# Patient Record
Sex: Female | Born: 1955 | Race: White | Hispanic: No | Marital: Married | Smoking: Never smoker
Health system: Southern US, Community
[De-identification: ages and names within clinical notes are randomized; demographics above are authoritative.]

## PROBLEM LIST (undated history)

## (undated) DIAGNOSIS — F419 Anxiety disorder, unspecified: Secondary | ICD-10-CM

## (undated) DIAGNOSIS — E039 Hypothyroidism, unspecified: Secondary | ICD-10-CM

## (undated) DIAGNOSIS — R609 Edema, unspecified: Secondary | ICD-10-CM

## (undated) DIAGNOSIS — E559 Vitamin D deficiency, unspecified: Secondary | ICD-10-CM

## (undated) DIAGNOSIS — R002 Palpitations: Secondary | ICD-10-CM

## (undated) DIAGNOSIS — T4145XA Adverse effect of unspecified anesthetic, initial encounter: Secondary | ICD-10-CM

## (undated) DIAGNOSIS — I10 Essential (primary) hypertension: Secondary | ICD-10-CM

## (undated) DIAGNOSIS — R0789 Other chest pain: Secondary | ICD-10-CM

## (undated) DIAGNOSIS — F32A Depression, unspecified: Secondary | ICD-10-CM

## (undated) DIAGNOSIS — K219 Gastro-esophageal reflux disease without esophagitis: Secondary | ICD-10-CM

## (undated) DIAGNOSIS — R0602 Shortness of breath: Secondary | ICD-10-CM

## (undated) DIAGNOSIS — Z8669 Personal history of other diseases of the nervous system and sense organs: Secondary | ICD-10-CM

## (undated) DIAGNOSIS — M549 Dorsalgia, unspecified: Secondary | ICD-10-CM

## (undated) DIAGNOSIS — M1990 Unspecified osteoarthritis, unspecified site: Secondary | ICD-10-CM

## (undated) DIAGNOSIS — E785 Hyperlipidemia, unspecified: Secondary | ICD-10-CM

## (undated) DIAGNOSIS — T8859XA Other complications of anesthesia, initial encounter: Secondary | ICD-10-CM

## (undated) DIAGNOSIS — M2550 Pain in unspecified joint: Secondary | ICD-10-CM

## (undated) HISTORY — DX: Shortness of breath: R06.02

## (undated) HISTORY — DX: Palpitations: R00.2

## (undated) HISTORY — DX: Depression, unspecified: F32.A

## (undated) HISTORY — DX: Essential (primary) hypertension: I10

## (undated) HISTORY — DX: Dorsalgia, unspecified: M54.9

## (undated) HISTORY — DX: Anxiety disorder, unspecified: F41.9

## (undated) HISTORY — DX: Vitamin D deficiency, unspecified: E55.9

## (undated) HISTORY — DX: Hyperlipidemia, unspecified: E78.5

## (undated) HISTORY — DX: Edema, unspecified: R60.9

## (undated) HISTORY — DX: Pain in unspecified joint: M25.50

## (undated) HISTORY — DX: Other chest pain: R07.89

## (undated) HISTORY — DX: Gastro-esophageal reflux disease without esophagitis: K21.9

---

## 1985-02-11 HISTORY — PX: TEMPOROMANDIBULAR JOINT SURGERY: SHX35

## 1998-09-26 ENCOUNTER — Emergency Department (HOSPITAL_COMMUNITY): Admission: EM | Admit: 1998-09-26 | Discharge: 1998-09-26 | Payer: Self-pay | Admitting: Emergency Medicine

## 1999-07-12 ENCOUNTER — Other Ambulatory Visit: Admission: RE | Admit: 1999-07-12 | Discharge: 1999-07-12 | Payer: Self-pay | Admitting: Obstetrics and Gynecology

## 1999-07-13 ENCOUNTER — Ambulatory Visit (HOSPITAL_COMMUNITY): Admission: RE | Admit: 1999-07-13 | Discharge: 1999-07-13 | Payer: Self-pay | Admitting: Obstetrics and Gynecology

## 1999-07-13 ENCOUNTER — Encounter: Admission: RE | Admit: 1999-07-13 | Discharge: 1999-07-13 | Payer: Self-pay | Admitting: Obstetrics and Gynecology

## 1999-07-13 ENCOUNTER — Encounter: Payer: Self-pay | Admitting: Obstetrics and Gynecology

## 2000-10-02 ENCOUNTER — Other Ambulatory Visit: Admission: RE | Admit: 2000-10-02 | Discharge: 2000-10-02 | Payer: Self-pay | Admitting: Obstetrics and Gynecology

## 2000-10-07 ENCOUNTER — Encounter: Payer: Self-pay | Admitting: Obstetrics and Gynecology

## 2000-10-07 ENCOUNTER — Encounter: Admission: RE | Admit: 2000-10-07 | Discharge: 2000-10-07 | Payer: Self-pay | Admitting: Obstetrics and Gynecology

## 2001-11-26 ENCOUNTER — Encounter: Payer: Self-pay | Admitting: Obstetrics and Gynecology

## 2001-11-26 ENCOUNTER — Encounter: Admission: RE | Admit: 2001-11-26 | Discharge: 2001-11-26 | Payer: Self-pay | Admitting: Obstetrics and Gynecology

## 2002-12-09 ENCOUNTER — Encounter: Admission: RE | Admit: 2002-12-09 | Discharge: 2002-12-09 | Payer: Self-pay | Admitting: Obstetrics and Gynecology

## 2005-02-11 HISTORY — PX: CERVICAL DISC SURGERY: SHX588

## 2005-03-29 ENCOUNTER — Encounter: Admission: RE | Admit: 2005-03-29 | Discharge: 2005-03-29 | Payer: Self-pay | Admitting: Family Medicine

## 2005-07-10 ENCOUNTER — Ambulatory Visit (HOSPITAL_COMMUNITY): Admission: RE | Admit: 2005-07-10 | Discharge: 2005-07-11 | Payer: Self-pay | Admitting: Neurosurgery

## 2006-01-16 ENCOUNTER — Encounter: Admission: RE | Admit: 2006-01-16 | Discharge: 2006-04-16 | Payer: Self-pay | Admitting: Family Medicine

## 2006-04-30 ENCOUNTER — Encounter: Admission: RE | Admit: 2006-04-30 | Discharge: 2006-04-30 | Payer: Self-pay | Admitting: Obstetrics and Gynecology

## 2006-09-23 ENCOUNTER — Encounter: Admission: RE | Admit: 2006-09-23 | Discharge: 2006-11-05 | Payer: Self-pay | Admitting: Family Medicine

## 2007-05-01 ENCOUNTER — Encounter: Admission: RE | Admit: 2007-05-01 | Discharge: 2007-05-01 | Payer: Self-pay | Admitting: Obstetrics and Gynecology

## 2008-03-31 ENCOUNTER — Encounter: Admission: RE | Admit: 2008-03-31 | Discharge: 2008-03-31 | Payer: Self-pay | Admitting: Neurosurgery

## 2008-05-19 ENCOUNTER — Encounter: Admission: RE | Admit: 2008-05-19 | Discharge: 2008-05-19 | Payer: Self-pay | Admitting: Obstetrics and Gynecology

## 2009-02-11 HISTORY — PX: VAGINAL HYSTERECTOMY: SUR661

## 2009-02-11 HISTORY — PX: BLADDER SURGERY: SHX569

## 2009-07-04 ENCOUNTER — Encounter: Admission: RE | Admit: 2009-07-04 | Discharge: 2009-07-04 | Payer: Self-pay | Admitting: Family Medicine

## 2009-10-09 ENCOUNTER — Ambulatory Visit (HOSPITAL_COMMUNITY)
Admission: RE | Admit: 2009-10-09 | Discharge: 2009-10-10 | Payer: Self-pay | Source: Home / Self Care | Admitting: Obstetrics and Gynecology

## 2009-10-09 ENCOUNTER — Encounter (HOSPITAL_COMMUNITY): Payer: Self-pay | Admitting: Obstetrics and Gynecology

## 2009-10-14 ENCOUNTER — Inpatient Hospital Stay (HOSPITAL_COMMUNITY): Admission: AD | Admit: 2009-10-14 | Discharge: 2009-10-14 | Payer: Self-pay | Admitting: Obstetrics and Gynecology

## 2010-01-11 HISTORY — PX: KNEE ARTHROSCOPY: SHX127

## 2010-03-04 ENCOUNTER — Encounter: Payer: Self-pay | Admitting: Neurosurgery

## 2010-04-26 LAB — URINALYSIS, ROUTINE W REFLEX MICROSCOPIC
Bilirubin Urine: NEGATIVE
Glucose, UA: NEGATIVE mg/dL
Nitrite: NEGATIVE
Specific Gravity, Urine: 1.02 (ref 1.005–1.030)
Urobilinogen, UA: 0.2 mg/dL (ref 0.0–1.0)

## 2010-04-26 LAB — DIFFERENTIAL
Basophils Absolute: 0 10*3/uL (ref 0.0–0.1)
Basophils Relative: 1 % (ref 0–1)
Eosinophils Absolute: 0.2 10*3/uL (ref 0.0–0.7)
Eosinophils Relative: 2 % (ref 0–5)

## 2010-04-26 LAB — CBC
Hemoglobin: 11.4 g/dL — ABNORMAL LOW (ref 12.0–15.0)
MCHC: 34.7 g/dL (ref 30.0–36.0)
MCV: 94 fL (ref 78.0–100.0)
RBC: 3.49 MIL/uL — ABNORMAL LOW (ref 3.87–5.11)

## 2010-04-26 LAB — URINE MICROSCOPIC-ADD ON

## 2010-04-27 LAB — URINALYSIS, ROUTINE W REFLEX MICROSCOPIC
Glucose, UA: NEGATIVE mg/dL
Hgb urine dipstick: NEGATIVE
Specific Gravity, Urine: >1.03 — ABNORMAL HIGH (ref 1.005–1.030)

## 2010-04-27 LAB — CBC
HCT: 29.6 % — ABNORMAL LOW (ref 36.0–46.0)
HCT: 35.1 % — ABNORMAL LOW (ref 36.0–46.0)
HCT: 41.7 % (ref 36.0–46.0)
MCH: 32.6 pg (ref 26.0–34.0)
MCHC: 34.1 g/dL (ref 30.0–36.0)
MCV: 94.4 fL (ref 78.0–100.0)
Platelets: 110 10*3/uL — ABNORMAL LOW (ref 150–400)
RBC: 3.13 MIL/uL — ABNORMAL LOW (ref 3.87–5.11)
RDW: 12.5 % (ref 11.5–15.5)
RDW: 12.6 % (ref 11.5–15.5)
WBC: 5.4 10*3/uL (ref 4.0–10.5)
WBC: 8.9 10*3/uL (ref 4.0–10.5)

## 2010-04-27 LAB — URINE MICROSCOPIC-ADD ON

## 2010-06-29 NOTE — Op Note (Signed)
NAME:  Kaitlin, Meyers                  ACCOUNT NO.:  [id number]   MEDICAL RECORD NO.:  [id number]          PATIENT TYPE:  AMB   LOCATION:  SDS                          FACILITY:  [facility]   PHYSICIAN:  Coletta Memos, M.D.     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   PROCEDURE:  1.  Anterior cervical decompression C5-6, C6-7.  2.  Arthrodesis C5-6, C6-7; at C5-6 with the use a 6-mm Synthes ACS PEEK      interbody graft; at C6-7 a 7-mm Synthes PEEK ACS graft.  3.  Morselized auto and allograft were combined.  4.  Anterior plating Synthes ACCS 30-mm plate.   COMPLICATIONS:  None.   SURGEON:  Coletta Memos, M.D.   ASSISTANT:  Hewitt Shorts, M.D.   INDICATIONS:  [name] is a woman who I have followed since this summer.  She  has what is a very large disk herniation at C5-6, and a significant amount  of spondylitic change present at C6-7.  She has narrowed disk spaces,  anterior and posterior osteophytes at both levels, and they clearly causing  her some neck pain which she has.  She describes some problems in the right  upper extremity which she did not have during the summer.  She did not have  pain in the arm, but did have some pain in the right shoulder and anterior  chest, in between her shoulder blades.   I recommended, she agreed to undergo an anterior cervical decompression and  arthrodesis with plating.   OPERATIVE TECHNIQUE:  [name] was brought to the operating room, intubated  and placed under a general anesthetic without difficulty.  Her neck was  prepped and she was draped in a sterile fashion.  Her  head was positioned  essentially in neutral position on a horseshoe headrest.  I opened the skin  with a #10 blade and took this down to the platysma sharply.  I dissected  rostrally and caudally in a plane superior to the platysma.  I opened the  platysma in a horizontal fashion.  I then dissected rostrally and caudally  in a plane inferior to the platysma.  I created an avascular corridor to the  cervical spine.  I placed a spinal needle and that was at C6-7.   I reflected the longus colli muscles bilaterally.  I placed a self-retaining  retractor and then distraction pins.  I opened the disk spaces of both C5-6  and C6-7.  I then proceeded with the operative microscope and brought that  into the field.  I removed the disk at C6-7, after placing distraction pins  at C6 and C7, using high-speed drill, curettes, Kerrison punches and  pituitary rongeurs.  There was a significant amount of  bony overgrowth and  some partial calcification of the posterior longitudinal ligament.  I was  able to remove this and observed the dura, which was being compressed by the  thickened ligament and disk.  I then decompressed both C7 nerve roots.  There was a sheet essentially of veins lying over the C7 nerve root on the  right side.  I opened that to some degree, but did not feel that, for a full  decompression, I needed to completely open that venous sheath, so I  therefore left it.  On the left side I achieved good decompression also with  Kerrison punches and a high-speed drill.   I prepared the endplates then for arthrodesis using the high-speed drill.  I  used the sizers and chose a 7-mm PEEK interbody graft.  I filled that with  morselized auto and allograft DBX bone putty.  I placed that into the disk  space, after using some Gelfoam to slow ooze in the lateral gutters.  I  placed the grafts and then let the distraction off.  I removed the pin at  C7.  I then placed that into C5.   I then repeated  the procedure and performed a complete diskectomy C5-6,  using curettes, pituitary rongeurs, high-speed drill and Kerrison punches.  This space was actually much tighter than was the C6-7 space.  I had to do a  significant amount of drilling to open up the disk space, and then to create  ledges so that the Kerrison punch was useful.  Dr. Danielle Dess assisted and we  were able to get underneath the posterior longitudinal ligament, which was  quite thick.  There was also an essentially chunk of old disk material which  was compressing the left side of the cord.  That was removed, after careful  dissection, without undue trauma.  I then completed the removal of the  posterior longitudinal ligament to fully decompress both C6 nerve roots.  Once that was done, I irrigated.  I then used a size template and chose a 6-  mm implant for that level.  I placed that, let the distraction off.  Hemostasis was obtained with some Gelfoam in the lateral margins again.   The endplates were prepared for arthrodesis at C5 with the use of a high-  speed drill.   I then proceeded to the anterior plating.  I chose a 30-mm plate.  Six  screws were used, two in C5, two in C6, two in C7.  Self-tapping screws were  used after using a hand drill to create the pilot holes.  This was done  without difficulty.  X-ray showed that the plate, screws and plugs were all  in good position.  I then irrigated the wound.  I then closed the wound in  layered fashion using Vicryl sutures to reapproximate the platysma and then  subcutaneous tissue.  Dermabond was used for sterile dressing.           ______________________________  Coletta Memos, M.D.     KC/MEDQ  D:  07/10/2005  T:  07/10/2005  Job:  [id number]

## 2010-12-27 ENCOUNTER — Other Ambulatory Visit: Payer: Self-pay | Admitting: Orthopedic Surgery

## 2010-12-31 ENCOUNTER — Encounter (HOSPITAL_COMMUNITY): Payer: Self-pay | Admitting: Pharmacy Technician

## 2011-01-03 ENCOUNTER — Other Ambulatory Visit (HOSPITAL_COMMUNITY): Payer: Self-pay

## 2011-01-10 ENCOUNTER — Encounter (HOSPITAL_COMMUNITY)
Admission: RE | Admit: 2011-01-10 | Discharge: 2011-01-10 | Disposition: A | Discharge status: Home / Self Care | Payer: BC Managed Care – PPO | Source: Ambulatory Visit | Attending: Orthopedic Surgery | Admitting: Orthopedic Surgery

## 2011-01-10 ENCOUNTER — Other Ambulatory Visit: Payer: Self-pay

## 2011-01-10 ENCOUNTER — Encounter (HOSPITAL_COMMUNITY): Payer: Self-pay

## 2011-01-10 HISTORY — DX: Adverse effect of unspecified anesthetic, initial encounter: T41.45XA

## 2011-01-10 HISTORY — DX: Hypothyroidism, unspecified: E03.9

## 2011-01-10 HISTORY — DX: Other complications of anesthesia, initial encounter: T88.59XA

## 2011-01-10 LAB — URINALYSIS, ROUTINE W REFLEX MICROSCOPIC
Bilirubin Urine: NEGATIVE
Glucose, UA: NEGATIVE mg/dL
Hgb urine dipstick: NEGATIVE
Ketones, ur: NEGATIVE mg/dL
Protein, ur: NEGATIVE mg/dL

## 2011-01-10 LAB — COMPREHENSIVE METABOLIC PANEL
ALT: 23 U/L (ref 0–35)
AST: 19 U/L (ref 0–37)
CO2: 27 mEq/L (ref 19–32)
Chloride: 101 mEq/L (ref 96–112)
Creatinine, Ser: 0.82 mg/dL (ref 0.50–1.10)
GFR calc Af Amer: >90 mL/min (ref >90)
GFR calc non Af Amer: 79 mL/min — ABNORMAL LOW (ref >90)
Glucose, Bld: 87 mg/dL (ref 70–99)
Sodium: 139 mEq/L (ref 135–145)
Total Bilirubin: 0.2 mg/dL — ABNORMAL LOW (ref 0.3–1.2)

## 2011-01-10 LAB — CBC
Hemoglobin: 15.3 g/dL — ABNORMAL HIGH (ref 12.0–15.0)
MCV: 90.9 fL (ref 78.0–100.0)
Platelets: 162 10*3/uL (ref 150–400)
RBC: 4.85 MIL/uL (ref 3.87–5.11)
WBC: 8.2 10*3/uL (ref 4.0–10.5)

## 2011-01-10 LAB — DIFFERENTIAL
Basophils Absolute: 0 10*3/uL (ref 0.0–0.1)
Basophils Relative: 1 % (ref 0–1)
Eosinophils Relative: 1 % (ref 0–5)
Monocytes Absolute: 0.5 10*3/uL (ref 0.1–1.0)

## 2011-01-10 LAB — SURGICAL PCR SCREEN
MRSA, PCR: NEGATIVE
Staphylococcus aureus: POSITIVE — AB

## 2011-01-10 MED ORDER — CHLORHEXIDINE GLUCONATE 4 % EX LIQD: 60.0000 mL | Freq: Once | CUTANEOUS | Status: DC

## 2011-01-10 NOTE — Pre-Procedure Instructions (Addendum)
20 Kaitlin Meyers  01/10/2011   Your procedure is scheduled on:  Dec. 3, 2012 Monday  Report to [facility] at 0930  Call this number if you have problems the morning of surgery: [phone]   Remember:   Do not eat food:After Midnight.  May have clear liquids: up to 4 Hours before arrival.  Clear liquids include soda, tea, black coffee, apple or grape juice, broth.  Take these medicines the morning of surgery with A SIP OF WATER: levoxyl   Do not wear jewelry, make-up or nail polish.  Do not wear lotions, powders, or perfumes. You may wear deodorant.  Do not shave 48 hours prior to surgery.  Do not bring valuables to the hospital.  Contacts, dentures or bridgework may not be worn into surgery.  Leave suitcase in the car. After surgery it may be brought to your room.  For patients admitted to the hospital, checkout time is 11:00 AM the day of discharge.   Patients discharged the day of surgery will not be allowed to drive home.  Name and phone number of your driver: NA  Special Instructions: CHG Shower Use Special Wash: 1/2 bottle night before surgery and 1/2 bottle morning of surgery.   Please read over the following fact sheets that you were given: Pain Booklet, Blood Transfusion Information, Total Joint Packet and Surgical Site Infection Prevention

## 2011-01-11 LAB — URINE CULTURE
Colony Count: 30000
Culture  Setup Time: 201211292108

## 2011-01-13 MED ORDER — VANCOMYCIN HCL IN DEXTROSE 1-5 GM/200ML-% IV SOLN
1000.0000 mg | INTRAVENOUS | Status: AC
  Administered 2011-01-14: 1000 mg via INTRAVENOUS
  Filled 2011-01-13: qty 200

## 2011-01-14 ENCOUNTER — Encounter (HOSPITAL_COMMUNITY): Payer: Self-pay | Admitting: *Deleted

## 2011-01-14 ENCOUNTER — Encounter (HOSPITAL_COMMUNITY)
Admission: RE | Disposition: A | Discharge status: Home Health | Payer: Self-pay | Source: Ambulatory Visit | Attending: Orthopedic Surgery

## 2011-01-14 ENCOUNTER — Encounter (HOSPITAL_COMMUNITY): Payer: Self-pay | Admitting: Certified Registered"

## 2011-01-14 ENCOUNTER — Inpatient Hospital Stay (HOSPITAL_COMMUNITY): Payer: BC Managed Care – PPO | Admitting: Certified Registered"

## 2011-01-14 ENCOUNTER — Inpatient Hospital Stay (HOSPITAL_COMMUNITY)
Admission: RE | Admit: 2011-01-14 | Discharge: 2011-01-16 | DRG: 209 | Disposition: A | Discharge status: Home Health | Payer: BC Managed Care – PPO | Source: Ambulatory Visit | Attending: Orthopedic Surgery | Admitting: Orthopedic Surgery

## 2011-01-14 ENCOUNTER — Inpatient Hospital Stay (HOSPITAL_COMMUNITY): Payer: BC Managed Care – PPO

## 2011-01-14 DIAGNOSIS — Z91040 Latex allergy status: Secondary | ICD-10-CM

## 2011-01-14 DIAGNOSIS — M1710 Unilateral primary osteoarthritis, unspecified knee: Principal | ICD-10-CM | POA: Diagnosis present

## 2011-01-14 DIAGNOSIS — Z01812 Encounter for preprocedural laboratory examination: Secondary | ICD-10-CM

## 2011-01-14 DIAGNOSIS — Z79899 Other long term (current) drug therapy: Secondary | ICD-10-CM

## 2011-01-14 DIAGNOSIS — Z880 Allergy status to penicillin: Secondary | ICD-10-CM

## 2011-01-14 DIAGNOSIS — E039 Hypothyroidism, unspecified: Secondary | ICD-10-CM | POA: Diagnosis present

## 2011-01-14 DIAGNOSIS — M1711 Unilateral primary osteoarthritis, right knee: Secondary | ICD-10-CM

## 2011-01-14 DIAGNOSIS — Z882 Allergy status to sulfonamides status: Secondary | ICD-10-CM

## 2011-01-14 DIAGNOSIS — D62 Acute posthemorrhagic anemia: Secondary | ICD-10-CM | POA: Diagnosis not present

## 2011-01-14 HISTORY — PX: REPLACEMENT UNICONDYLAR JOINT KNEE: SUR1227

## 2011-01-14 HISTORY — PX: PARTIAL KNEE ARTHROPLASTY: SHX2174

## 2011-01-14 HISTORY — DX: Unspecified osteoarthritis, unspecified site: M19.90

## 2011-01-14 HISTORY — DX: Personal history of other diseases of the nervous system and sense organs: Z86.69

## 2011-01-14 LAB — TYPE AND SCREEN

## 2011-01-14 LAB — ABO/RH: ABO/RH(D): O POS

## 2011-01-14 SURGERY — ARTHROPLASTY, KNEE, UNICOMPARTMENTAL
Anesthesia: Choice | Site: Knee | Laterality: Right | Wound class: Clean

## 2011-01-14 MED ORDER — METOCLOPRAMIDE HCL 5 MG PO TABS
5.0000 mg | ORAL_TABLET | Freq: Three times a day (TID) | ORAL | Status: DC | PRN
  Filled 2011-01-14: qty 2

## 2011-01-14 MED ORDER — ONDANSETRON HCL 4 MG/2ML IJ SOLN: 4.0000 mg | Freq: Four times a day (QID) | INTRAMUSCULAR | Status: DC | PRN

## 2011-01-14 MED ORDER — FENTANYL CITRATE 0.05 MG/ML IJ SOLN
100.0000 ug | Freq: Once | INTRAMUSCULAR | Status: AC
  Administered 2011-01-14: 50 ug via INTRAVENOUS

## 2011-01-14 MED ORDER — BUPIVACAINE-EPINEPHRINE PF 0.5-1:200000 % IJ SOLN
INTRAMUSCULAR | Status: DC | PRN
  Administered 2011-01-14: 20 mL

## 2011-01-14 MED ORDER — ONDANSETRON HCL 4 MG PO TABS: 4.0000 mg | ORAL_TABLET | Freq: Four times a day (QID) | ORAL | Status: DC | PRN

## 2011-01-14 MED ORDER — FLUTICASONE FUROATE 27.5 MCG/SPRAY NA SUSP: 1.0000 | Freq: Every day | NASAL | Status: DC | PRN

## 2011-01-14 MED ORDER — ZOLPIDEM TARTRATE 5 MG PO TABS: 5.0000 mg | ORAL_TABLET | Freq: Every evening | ORAL | Status: DC | PRN

## 2011-01-14 MED ORDER — MIDAZOLAM HCL 5 MG/5ML IJ SOLN
INTRAMUSCULAR | Status: DC | PRN
  Administered 2011-01-14: 2 mg via INTRAVENOUS

## 2011-01-14 MED ORDER — LEVOTHYROXINE SODIUM 100 MCG PO TABS
100.0000 ug | ORAL_TABLET | Freq: Every day | ORAL | Status: DC
  Administered 2011-01-14 – 2011-01-15 (×2): 100 ug via ORAL
  Filled 2011-01-14 (×3): qty 1

## 2011-01-14 MED ORDER — LIDOCAINE HCL (CARDIAC) 20 MG/ML IV SOLN
INTRAVENOUS | Status: DC | PRN
  Administered 2011-01-14: 100 mg via INTRAVENOUS

## 2011-01-14 MED ORDER — SENNOSIDES-DOCUSATE SODIUM 8.6-50 MG PO TABS: 1.0000 | ORAL_TABLET | Freq: Every evening | ORAL | Status: DC | PRN

## 2011-01-14 MED ORDER — METHOCARBAMOL 100 MG/ML IJ SOLN
500.0000 mg | Freq: Four times a day (QID) | INTRAVENOUS | Status: DC | PRN
  Filled 2011-01-14: qty 5

## 2011-01-14 MED ORDER — PHENYLEPHRINE HCL 10 MG/ML IJ SOLN
10.0000 mg | INTRAVENOUS | Status: DC | PRN
  Administered 2011-01-14: 40 ug/min via INTRAVENOUS
  Administered 2011-01-14: 25 ug via INTRAVENOUS

## 2011-01-14 MED ORDER — ACETAMINOPHEN 650 MG RE SUPP: 650.0000 mg | Freq: Four times a day (QID) | RECTAL | Status: DC | PRN

## 2011-01-14 MED ORDER — MENTHOL 3 MG MT LOZG: 1.0000 | LOZENGE | OROMUCOSAL | Status: DC | PRN

## 2011-01-14 MED ORDER — FLEET ENEMA 7-19 GM/118ML RE ENEM: 1.0000 | ENEMA | Freq: Once | RECTAL | Status: AC | PRN

## 2011-01-14 MED ORDER — DIPHENHYDRAMINE HCL 12.5 MG/5ML PO ELIX
12.5000 mg | ORAL_SOLUTION | ORAL | Status: DC | PRN
  Filled 2011-01-14: qty 10

## 2011-01-14 MED ORDER — METOCLOPRAMIDE HCL 5 MG/ML IJ SOLN
5.0000 mg | Freq: Three times a day (TID) | INTRAMUSCULAR | Status: DC | PRN
  Filled 2011-01-14: qty 2

## 2011-01-14 MED ORDER — DOCUSATE SODIUM 100 MG PO CAPS
100.0000 mg | ORAL_CAPSULE | Freq: Two times a day (BID) | ORAL | Status: DC
  Administered 2011-01-14 – 2011-01-16 (×4): 100 mg via ORAL
  Filled 2011-01-14 (×4): qty 1

## 2011-01-14 MED ORDER — ONDANSETRON HCL 4 MG/2ML IJ SOLN
INTRAMUSCULAR | Status: AC
  Filled 2011-01-14: qty 2

## 2011-01-14 MED ORDER — ENOXAPARIN SODIUM 30 MG/0.3ML ~~LOC~~ SOLN
30.0000 mg | Freq: Two times a day (BID) | SUBCUTANEOUS | Status: DC
  Administered 2011-01-14 – 2011-01-16 (×4): 30 mg via SUBCUTANEOUS
  Filled 2011-01-14 (×5): qty 0.3

## 2011-01-14 MED ORDER — HYDROMORPHONE HCL PF 1 MG/ML IJ SOLN
0.5000 mg | INTRAMUSCULAR | Status: DC | PRN
  Administered 2011-01-14: 1 mg via INTRAVENOUS
  Filled 2011-01-14 (×3): qty 1

## 2011-01-14 MED ORDER — OXYCODONE HCL 20 MG PO TB12
20.0000 mg | ORAL_TABLET | Freq: Two times a day (BID) | ORAL | Status: DC
  Administered 2011-01-14: 20 mg via ORAL
  Filled 2011-01-14: qty 1

## 2011-01-14 MED ORDER — LACTATED RINGERS IV SOLN
INTRAVENOUS | Status: DC
  Administered 2011-01-14: 11:00:00 via INTRAVENOUS

## 2011-01-14 MED ORDER — MUPIROCIN 2 % EX OINT
TOPICAL_OINTMENT | Freq: Two times a day (BID) | CUTANEOUS | Status: AC
  Administered 2011-01-14 – 2011-01-15 (×3): via NASAL
  Filled 2011-01-14: qty 22

## 2011-01-14 MED ORDER — FENTANYL CITRATE 0.05 MG/ML IJ SOLN
INTRAMUSCULAR | Status: DC | PRN
  Administered 2011-01-14: 100 ug via INTRAVENOUS
  Administered 2011-01-14: 50 ug via INTRAVENOUS
  Administered 2011-01-14: 100 ug via INTRAVENOUS
  Administered 2011-01-14: 50 ug via INTRAVENOUS

## 2011-01-14 MED ORDER — FENTANYL CITRATE 0.05 MG/ML IJ SOLN
INTRAMUSCULAR | Status: AC
  Filled 2011-01-14: qty 2

## 2011-01-14 MED ORDER — OXYCODONE HCL 5 MG PO TABS
5.0000 mg | ORAL_TABLET | ORAL | Status: DC | PRN
  Administered 2011-01-15: 10 mg via ORAL
  Administered 2011-01-15: 5 mg via ORAL
  Filled 2011-01-14: qty 2
  Filled 2011-01-14 (×2): qty 1

## 2011-01-14 MED ORDER — ONDANSETRON HCL 4 MG/2ML IJ SOLN
INTRAMUSCULAR | Status: DC | PRN
  Administered 2011-01-14: 4 mg via INTRAVENOUS

## 2011-01-14 MED ORDER — HYDROMORPHONE HCL PF 1 MG/ML IJ SOLN
0.2500 mg | INTRAMUSCULAR | Status: DC | PRN
  Administered 2011-01-14: 0.5 mg via INTRAVENOUS

## 2011-01-14 MED ORDER — ACETAMINOPHEN 325 MG PO TABS
650.0000 mg | ORAL_TABLET | Freq: Four times a day (QID) | ORAL | Status: DC | PRN
  Administered 2011-01-15: 650 mg via ORAL
  Filled 2011-01-14: qty 2

## 2011-01-14 MED ORDER — ALUM & MAG HYDROXIDE-SIMETH 200-200-20 MG/5ML PO SUSP
30.0000 mL | ORAL | Status: DC | PRN
  Administered 2011-01-15: 30 mL via ORAL
  Filled 2011-01-14: qty 30

## 2011-01-14 MED ORDER — DEXAMETHASONE SODIUM PHOSPHATE 10 MG/ML IJ SOLN
INTRAMUSCULAR | Status: DC | PRN
  Administered 2011-01-14: 8 mg via INTRAVENOUS

## 2011-01-14 MED ORDER — ALBUMIN HUMAN 5 % IV SOLN: INTRAVENOUS | Status: DC | PRN

## 2011-01-14 MED ORDER — ACETAMINOPHEN 10 MG/ML IV SOLN
INTRAVENOUS | Status: DC | PRN
  Administered 2011-01-14: 1000 mg via INTRAVENOUS

## 2011-01-14 MED ORDER — ACETAMINOPHEN 10 MG/ML IV SOLN
INTRAVENOUS | Status: AC
  Filled 2011-01-14: qty 100

## 2011-01-14 MED ORDER — VANCOMYCIN HCL IN DEXTROSE 1-5 GM/200ML-% IV SOLN
1000.0000 mg | Freq: Two times a day (BID) | INTRAVENOUS | Status: AC
  Administered 2011-01-15: 1000 mg via INTRAVENOUS
  Filled 2011-01-14: qty 200

## 2011-01-14 MED ORDER — PROPOFOL 10 MG/ML IV EMUL
INTRAVENOUS | Status: DC | PRN
  Administered 2011-01-14: 200 mL via INTRAVENOUS

## 2011-01-14 MED ORDER — DEXTROSE 5 % IV SOLN
INTRAVENOUS | Status: DC | PRN
  Administered 2011-01-14: 12:00:00 via INTRAVENOUS

## 2011-01-14 MED ORDER — FLUTICASONE PROPIONATE 50 MCG/ACT NA SUSP
1.0000 | Freq: Every day | NASAL | Status: DC | PRN
  Filled 2011-01-14: qty 16

## 2011-01-14 MED ORDER — PHENOL 1.4 % MT LIQD: 1.0000 | OROMUCOSAL | Status: DC | PRN

## 2011-01-14 MED ORDER — BUPIVACAINE HCL (PF) 0.25 % IJ SOLN
INTRAMUSCULAR | Status: DC | PRN
  Administered 2011-01-14: 20 mL

## 2011-01-14 MED ORDER — LACTATED RINGERS IV SOLN
INTRAVENOUS | Status: DC | PRN
  Administered 2011-01-14 (×2): via INTRAVENOUS

## 2011-01-14 MED ORDER — METHOCARBAMOL 100 MG/ML IJ SOLN
500.0000 mg | Freq: Once | INTRAVENOUS | Status: AC
  Administered 2011-01-14: 500 mg via INTRAVENOUS
  Filled 2011-01-14: qty 5

## 2011-01-14 MED ORDER — SODIUM CHLORIDE 0.9 % IV SOLN
INTRAVENOUS | Status: DC
  Administered 2011-01-14: 20:00:00 via INTRAVENOUS

## 2011-01-14 MED ORDER — SODIUM CHLORIDE 0.9 % IV SOLN
INTRAVENOUS | Status: DC | PRN
  Administered 2011-01-14: 12:00:00 via INTRAVENOUS

## 2011-01-14 MED ORDER — ACETAMINOPHEN 10 MG/ML IV SOLN
1000.0000 mg | Freq: Once | INTRAVENOUS | Status: AC
  Administered 2011-01-14: 1000 mg via INTRAVENOUS

## 2011-01-14 MED ORDER — ONDANSETRON HCL 4 MG/2ML IJ SOLN
4.0000 mg | Freq: Once | INTRAMUSCULAR | Status: AC | PRN
  Administered 2011-01-14: 4 mg via INTRAVENOUS

## 2011-01-14 MED ORDER — METHOCARBAMOL 500 MG PO TABS
500.0000 mg | ORAL_TABLET | Freq: Four times a day (QID) | ORAL | Status: DC | PRN
  Administered 2011-01-14 – 2011-01-16 (×5): 500 mg via ORAL
  Filled 2011-01-14 (×6): qty 1

## 2011-01-14 MED ORDER — SODIUM CHLORIDE 0.9 % IR SOLN
Status: DC | PRN
  Administered 2011-01-14: 1000 mL
  Administered 2011-01-14: 3000 mL

## 2011-01-14 MED ORDER — BISACODYL 5 MG PO TBEC: 5.0000 mg | DELAYED_RELEASE_TABLET | Freq: Every day | ORAL | Status: DC | PRN

## 2011-01-14 SURGICAL SUPPLY — 62 items
BANDAGE ESMARK 6X9 LF (GAUZE/BANDAGES/DRESSINGS) ×1 IMPLANT
BLADE SAGITTAL 13X1.27X60 (BLADE) ×2 IMPLANT
BLADE SAW RECIP 87.9 MT (BLADE) ×2 IMPLANT
BLADE SAW SAG 90X13X1.27 (BLADE) ×2 IMPLANT
BLADE SAW SGTL 13X75X1.27 (BLADE) IMPLANT
BLADE SAW SGTL 83.5X18.5 (BLADE) IMPLANT
BNDG ELASTIC 6X10 VLCR STRL LF (GAUZE/BANDAGES/DRESSINGS) IMPLANT
BNDG ESMARK 6X9 LF (GAUZE/BANDAGES/DRESSINGS) ×2
BOWL SMART MIX CTS (DISPOSABLE) ×2 IMPLANT
CEMENT BONE SIMPLEX SPEEDSET (Cement) ×2 IMPLANT
CLOTH BEACON ORANGE TIMEOUT ST (SAFETY) ×2 IMPLANT
COVER BACK TABLE 24X17X13 BIG (DRAPES) IMPLANT
COVER SURGICAL LIGHT HANDLE (MISCELLANEOUS) ×2 IMPLANT
CUFF TOURNIQUET SINGLE 34IN LL (TOURNIQUET CUFF) ×2 IMPLANT
DRAPE C-ARM 42X72 X-RAY (DRAPES) ×2 IMPLANT
DRAPE EXTREMITY T 121X128X90 (DRAPE) ×2 IMPLANT
DRAPE INCISE IOBAN 66X45 STRL (DRAPES) ×6 IMPLANT
DRAPE PROXIMA HALF (DRAPES) ×2 IMPLANT
DRAPE U-SHAPE 47X51 STRL (DRAPES) ×2 IMPLANT
DRSG ADAPTIC 3X8 NADH LF (GAUZE/BANDAGES/DRESSINGS) ×2 IMPLANT
DRSG PAD ABDOMINAL 8X10 ST (GAUZE/BANDAGES/DRESSINGS) ×2 IMPLANT
DURAPREP 26ML APPLICATOR (WOUND CARE) ×4 IMPLANT
ELECT REM PT RETURN 9FT ADLT (ELECTROSURGICAL) ×2
ELECTRODE REM PT RTRN 9FT ADLT (ELECTROSURGICAL) ×1 IMPLANT
EVACUATOR 1/8 PVC DRAIN (DRAIN) IMPLANT
FLUID NSS /IRRIG 3000 ML XXX (IV SOLUTION) ×2 IMPLANT
GLOVE BIOGEL M 7.0 STRL (GLOVE) IMPLANT
GLOVE BIOGEL PI IND STRL 7.0 (GLOVE) ×1 IMPLANT
GLOVE BIOGEL PI IND STRL 7.5 (GLOVE) IMPLANT
GLOVE BIOGEL PI IND STRL 8.5 (GLOVE) ×2 IMPLANT
GLOVE BIOGEL PI INDICATOR 7.0 (GLOVE) ×1
GLOVE BIOGEL PI INDICATOR 7.5 (GLOVE)
GLOVE BIOGEL PI INDICATOR 8.5 (GLOVE) ×2
GLOVE SURG ORTHO 8.0 STRL STRW (GLOVE) IMPLANT
GLOVE SURG SS PI 6.5 STRL IVOR (GLOVE) ×2 IMPLANT
GLOVE SURG SS PI 7.0 STRL IVOR (GLOVE) ×2 IMPLANT
GOWN PREVENTION PLUS XLARGE (GOWN DISPOSABLE) ×8 IMPLANT
GOWN STRL NON-REIN LRG LVL3 (GOWN DISPOSABLE) ×2 IMPLANT
HANDPIECE INTERPULSE COAX TIP (DISPOSABLE) ×1
HOOD PEEL AWAY FACE SHEILD DIS (HOOD) ×8 IMPLANT
KIT BASIN OR (CUSTOM PROCEDURE TRAY) ×2 IMPLANT
KIT ROOM TURNOVER OR (KITS) ×2 IMPLANT
MANIFOLD NEPTUNE II (INSTRUMENTS) ×2 IMPLANT
NEEDLE 22X1 1/2 (OR ONLY) (NEEDLE) ×2 IMPLANT
NS IRRIG 1000ML POUR BTL (IV SOLUTION) ×2 IMPLANT
PACK TOTAL JOINT (CUSTOM PROCEDURE TRAY) ×2 IMPLANT
PAD ARMBOARD 7.5X6 YLW CONV (MISCELLANEOUS) ×2 IMPLANT
PADDING CAST COTTON 6X4 STRL (CAST SUPPLIES) ×2 IMPLANT
SET HNDPC FAN SPRY TIP SCT (DISPOSABLE) ×1 IMPLANT
SPONGE GAUZE 4X4 12PLY (GAUZE/BANDAGES/DRESSINGS) ×2 IMPLANT
STAPLER VISISTAT 35W (STAPLE) ×2 IMPLANT
SUCTION FRAZIER TIP 10 FR DISP (SUCTIONS) ×2 IMPLANT
SUT VIC AB 0 CT1 27 (SUTURE) ×2
SUT VIC AB 0 CT1 27XBRD ANBCTR (SUTURE) ×2 IMPLANT
SUT VIC AB 1 CT1 27 (SUTURE) ×1
SUT VIC AB 1 CT1 27XBRD ANBCTR (SUTURE) ×1 IMPLANT
SUT VIC AB 2-0 CT1 27 (SUTURE) ×1
SUT VIC AB 2-0 CT1 TAPERPNT 27 (SUTURE) ×1 IMPLANT
SYR CONTROL 10ML LL (SYRINGE) ×2 IMPLANT
TOWEL OR 17X24 6PK STRL BLUE (TOWEL DISPOSABLE) ×2 IMPLANT
TOWEL OR 17X26 10 PK STRL BLUE (TOWEL DISPOSABLE) ×2 IMPLANT
WATER STERILE IRR 1000ML POUR (IV SOLUTION) IMPLANT

## 2011-01-14 NOTE — Anesthesia Preprocedure Evaluation (Addendum)
Anesthesia Evaluation  Patient identified by MRN, date of birth, ID band Patient awake    Reviewed: Allergy & Precautions, H&P , NPO status , Patient's Chart, lab work & pertinent test results  History of Anesthesia Complications (+) Family history of anesthesia reaction  Airway Mallampati: II TM Distance: >3 FB Neck ROM: full  Mouth opening: Limited Mouth Opening  Dental  (+) Teeth Intact, Caps and Dental Advisory Given,    Pulmonary neg pulmonary ROS,    Pulmonary exam normal       Cardiovascular Exercise Tolerance: Good neg cardio ROS regular Normal    Neuro/Psych PSYCHIATRIC DISORDERS (Pt w/ anxiety this am.  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     Plan Discussed with: Surgeon, CRNA and Anesthesiologist  Anesthesia Plan Comments:         Anesthesia Quick Evaluation

## 2011-01-14 NOTE — H&P (Signed)
  Kaitlin Meyers MRN:  [id number] DOB/SEX:  10/14/1955/female  CHIEF COMPLAINT:  Painful right Knee  HISTORY: Patient is a 55 y.o. female presented with a history of pain in the right knee. Onset of symptoms was gradual starting several years ago with gradually worsening course since that time. The patient noted no past surgery on the right knee. Prior procedures on the knee include meniscectomy. Patient has been treated conservatively with over-the-counter NSAIDs and activity modification. Patient currently rates pain in the knee at 8 out of 10 with activity. There is pain at night.  PAST MEDICAL HISTORY: There are no active problems to display for this patient.  Past Medical History  Diagnosis Date  . Complication of anesthesia     difficulty awaking about 36 yrs ago  . Hypothyroidism    Past Surgical History  Procedure Date  . Abdominal hysterectomy   . Knee arthroscopy 01/2010  . Cervical disc surgery   . Temporomandibular joint surgery 1987  . Bladder surgery      MEDICATIONS:   No prescriptions prior to admission    ALLERGIES:   Allergies  Allergen Reactions  . Azithromycin Other (See Comments)    Pt states medication makes her have "hallucination"  . Latex Hives    welps  . Penicillins Hives and Swelling  . Sulfa Antibiotics Nausea Only    REVIEW OF SYSTEMS:  Pertinent items are noted in HPI.   FAMILY HISTORY:  No family history on file.  SOCIAL HISTORY:   History  Substance Use Topics  . Smoking status: Never Smoker   . Smokeless tobacco: Not on file  . Alcohol Use: 0.6 oz/week    1 Glasses of wine per week     EXAMINATION:  Vital signs in last 24 hours:    General appearance: alert, cooperative and no distress Head: Normocephalic, without obvious abnormality, atraumatic Lungs: clear to auscultation bilaterally Heart: regular rate and rhythm, S1, S2 normal, no murmur, click, rub or gallop Abdomen: soft, non-tender; bowel sounds normal; no  masses,  no organomegaly Extremities: extremities normal, atraumatic, no cyanosis or edema and Homans sign is negative, no sign of DVT Pulses: 2+ and symmetric Skin: Skin color, texture, turgor normal. No rashes or lesions Neurologic: Alert and oriented X 3, normal strength and tone. Normal symmetric reflexes. Normal coordination and gait  Musculoskeletal:  ROM 0-125, Ligaments intact,  Imaging Review Plain radiographs demonstrate severe degenerative joint disease of the right knee. The overall alignment is mild varus. The bone quality appears to be good for age and reported activity level.  Assessment/Plan: End stage arthritis, right knee   The patient history, physical examination and imaging studies are consistent with advanced degenerative joint disease of the right knee. The patient has failed conservative treatment.  The clearance notes were reviewed.  After discussion with the patient it was felt that Total Knee Replacement was indicated. The procedure,  risks, and benefits of total knee arthroplasty were presented and reviewed. The risks including but not limited to aseptic loosening, infection, blood clots, vascular injury, stiffness, patella tracking problems complications among others were discussed. The patient acknowledged the explanation, agreed to proceed with the plan.  [name] 01/14/2011, 7:00 AM

## 2011-01-14 NOTE — Op Note (Signed)
Dictation [id number]

## 2011-01-14 NOTE — Transfer of Care (Signed)
Immediate Anesthesia Transfer of Care Note  Patient: Kaitlin Meyers  Procedure(s) Performed:  UNICOMPARTMENTAL KNEE  Patient Location: PACU  Anesthesia Type: General  Level of Consciousness: awake, alert  and oriented  Airway & Oxygen Therapy: Patient Spontanous Breathing  Post-op Assessment: Report given to PACU RN and Post -op Vital signs reviewed and stable  Post vital signs: Reviewed and stable  Complications: No apparent anesthesia complications

## 2011-01-14 NOTE — Preoperative (Signed)
Beta Blockers   Reason not to administer Beta Blockers:Not Applicable 

## 2011-01-14 NOTE — Anesthesia Postprocedure Evaluation (Signed)
  Anesthesia Post-op Note  Patient: JEMYA DEPIERRO  Procedure(s) Performed:  UNICOMPARTMENTAL KNEE  Patient Location: PACU  Anesthesia Type: General and GA combined with regional for post-op pain  Level of Consciousness: awake and alert   Airway and Oxygen Therapy: Patient Spontanous Breathing and Patient connected to nasal cannula oxygen  Post-op Pain: mild  Post-op Assessment: Post-op Vital signs reviewed, Patient's Cardiovascular Status Stable, Respiratory Function Stable, Patent Airway, No signs of Nausea or vomiting and Pain level controlled  Post-op Vital Signs: Reviewed and stable  Complications: No apparent anesthesia complications

## 2011-01-14 NOTE — Anesthesia Procedure Notes (Addendum)
Anesthesia Regional Block:  Femoral nerve block  Pre-Anesthetic Checklist: ,, timeout performed, Correct Patient, Correct Site, Correct Laterality, Correct Procedure, Correct Position, site marked, Risks and benefits discussed,  Surgical consent,  Pre-op evaluation,  At surgeon's request and post-op pain management  Laterality: Right  Prep: chloraprep       Needles:  Injection technique: Single-shot  Needle Type: Stimulator Needle - 80        Needle insertion depth: 6 cm   Additional Needles:  Procedures: nerve stimulator Femoral nerve block  Nerve Stimulator or Paresthesia:  Response: Femerol  (Quad), 0.6 mA, 0.1 ms, 7 cm  Additional Responses:   Narrative:  Start time: 01/14/2011 11:47 AM End time: 01/14/2011 11:54 AM Injection made incrementally with aspirations every 5 mL.  Performed by: Personally   Additional Notes: 20cc 0.5% Marcaine w/o difficulty    Procedure Name: LMA Insertion Date/Time: 01/14/2011 11:52 AM Performed by: Tyrone Nine Pre-anesthesia Checklist: Patient identified, Emergency Drugs available, Suction available and Patient being monitored Patient Re-evaluated:Patient Re-evaluated prior to inductionOxygen Delivery Method: Circle System Utilized Preoxygenation: Pre-oxygenation with 100% oxygen Intubation Type: IV induction Ventilation: Mask ventilation without difficulty LMA: LMA with gastric port inserted LMA Size: 4.0 Placement Confirmation: positive ETCO2 Tube secured with: Tape Dental Injury: Teeth and Oropharynx as per pre-operative assessment

## 2011-01-15 ENCOUNTER — Encounter (HOSPITAL_COMMUNITY): Payer: Self-pay | Admitting: General Practice

## 2011-01-15 DIAGNOSIS — Z8669 Personal history of other diseases of the nervous system and sense organs: Secondary | ICD-10-CM

## 2011-01-15 HISTORY — DX: Personal history of other diseases of the nervous system and sense organs: Z86.69

## 2011-01-15 LAB — CBC
MCV: 90.1 fL (ref 78.0–100.0)
Platelets: 149 10*3/uL — ABNORMAL LOW (ref 150–400)
RDW: 13 % (ref 11.5–15.5)
WBC: 12.3 10*3/uL — ABNORMAL HIGH (ref 4.0–10.5)

## 2011-01-15 LAB — BASIC METABOLIC PANEL
Chloride: 104 mEq/L (ref 96–112)
Creatinine, Ser: 0.65 mg/dL (ref 0.50–1.10)
GFR calc Af Amer: >90 mL/min (ref >90)

## 2011-01-15 MED ORDER — HYDROCODONE-ACETAMINOPHEN 5-325 MG PO TABS
1.0000 | ORAL_TABLET | ORAL | Status: DC | PRN
  Administered 2011-01-15 (×2): 1 via ORAL
  Administered 2011-01-16 (×2): 2 via ORAL
  Filled 2011-01-15 (×2): qty 2
  Filled 2011-01-15 (×2): qty 1

## 2011-01-15 MED ORDER — MELOXICAM 15 MG PO TABS
15.0000 mg | ORAL_TABLET | Freq: Every day | ORAL | Status: DC
  Administered 2011-01-15 – 2011-01-16 (×2): 15 mg via ORAL
  Filled 2011-01-15 (×2): qty 1

## 2011-01-15 MED ORDER — ALPRAZOLAM 0.25 MG PO TABS
0.2500 mg | ORAL_TABLET | Freq: Three times a day (TID) | ORAL | Status: DC | PRN
  Administered 2011-01-15: 0.25 mg via ORAL
  Filled 2011-01-15: qty 1

## 2011-01-15 MED ORDER — OXYCODONE HCL 5 MG PO TABS
5.0000 mg | ORAL_TABLET | ORAL | Status: DC | PRN
  Administered 2011-01-15 – 2011-01-16 (×3): 5 mg via ORAL
  Filled 2011-01-15 (×3): qty 1

## 2011-01-15 NOTE — Progress Notes (Signed)
Pt arrived from 3000. Was shaking and crying and complaining of severe pain in right knee. Norco given with decrease in pain from 10 to 6. CPM started. Pt unable to tolerate flexion past 25 degrees. Was able to stay in CPM for 2 hours. Will reapply as pt calms down and has better pain control.  Dr. Eulah Pont contacted since pt refused to try dilaudid for pain since she had adverse reaction last night according to pt.  Ordered Oxycodone and xanax. Pt much calmer later in evening. Able to stand and walk to commode.

## 2011-01-15 NOTE — Progress Notes (Signed)
PT/OT evaluations completed. See notes for details.Recommend HHPT/OTat D/C. Will follow acutely thanks!  [facility] [phone] (pager)

## 2011-01-15 NOTE — Consult Note (Signed)
Kaitlin Meyers, Kaitlin Meyers              ACCOUNT NO.:  [id number]  MEDICAL RECORD NO.:  [id number]  LOCATION:  3041                         FACILITY:  [facility]  PHYSICIAN:  Mila Homer. Sherlean Foot, M.D. DATE OF BIRTH:  04-21-1955  DATE OF CONSULTATION:  01/14/2011 DATE OF DISCHARGE:                                CONSULTATION   SURGEON:  Mila Homer. Sherlean Foot, MD  ASSISTANT:  Altamese Cabal, PA-C  ANESTHESIA:  General.  PREOPERATIVE DIAGNOSIS:  Right knee medial compartment osteoarthritis.  POSTOPERATIVE DIAGNOSIS:  Right knee medial compartment osteoarthritis.  PROCEDURE:  Right knee unicompartmental arthroplasty.  INDICATION FOR PROCEDURE:  The patient is a 55 year old white female who has failed conservative measures for osteoarthritis of the knee. Informed consent was obtained.  DESCRIPTION OF PROCEDURE:  The patient was laid supine, administered general anesthesia, the right leg prepped and draped in usual sterile fashion.  The extremity was exsanguinated with the Esmarch, and the tourniquet inflated to 350 mmHg.  A midline incision was made from the inferior pole of the patella to the tibial tubercle.  A new blade was used to incise the medial side of the patellar tendon up to the 3 o'clock position on the patella.  I removed the retropatellar fat pad in this medial side of the knee.  I then elevated the deep MCL off the medial crest of the tibia.  I then removed the anterior lip of the tibia and placed the tibial tower in place with the sham intra-articularly, tension in the ligaments and extension and placed the alignment rods and took C-arm images to ensure I was in the mechanical axis, and then pinned the distal femoral cutting block at the proximal tibia with tibial cutting block in place.  I made the distal femoral cut first, then removed the jig, and went to do flexion cut on the tibial side, finished it off with a reciprocating saw.  I then placed a C femoral trial on the femur,  pinned it into placed, drilled 2 lug holes and a cut for the chamfer and posterior condylar cuts.  I removed the cut surfaces.  I then fashioned a 2 tibial trial to the cut surface of the tibia, drilled the lugs for that as well and then trialed with a C femur.  The 9 insert had good flexion-extension gap balance and good tracking.  The patellofemoral joint looked great.  I then copiously irrigated with __________ components removing all excess cement and allowed to harden in extension.  Closed the arthrotomy with figure-of- eight #1 Vicryl sutures, infiltrated with 20 mL of Marcaine and morphine mixture, closed the deep soft tissue with buried 0 Vicryl sutures, subcuticular 2-0 Vicryl stitches and then skin staples.  Dressed with Xeroform, dressing sponges, Webril with TED stockings.  COMPLICATIONS:  None.  DRAINS:  None.  ESTIMATED BLOOD LOSS:  Minimal.  TOURNIQUET TIME:  64 minutes.          ______________________________ Mila Homer Sherlean Foot, M.D.     SDL/MEDQ  D:  01/14/2011  T:  01/14/2011  Job:  [id number]

## 2011-01-15 NOTE — Progress Notes (Addendum)
Physical Therapy Treatment Patient Details Name: Kaitlin Meyers MRN: [id number] DOB: Aug 20, 1955 Today's Date: 01/15/2011  PT Assessment/Plan  PT - Assessment/Plan Comments on Treatment Session: Patient is s/p unicompartmental knee with limitations in mobility secondary to dizziness and poor pain control.  [place], [place] per pt. request to make him aware of her pain control issues.  Maurice to f/u at 5 pm today.  Patient is safe to D/C home with family using a RW and wtih HHPT/HHOT arranged per PT assessment is patient feels comfortable doing so once pain under control.   PT Plan: Discharge plan remains appropriate;Frequency remains appropriate PT Frequency: 7X/week Follow Up Recommendations: Home health PT Equipment Recommended: None recommended by PT PT Goals  Acute Rehab PT Goals PT Goal Formulation: With patient/family Time For Goal Achievement: 3 days Pt will go Supine/Side to Sit: with modified independence PT Goal: Supine/Side to Sit - Progress: Progressing toward goal Pt will go Sit to Stand: with modified independence PT Goal: Sit to Stand - Progress: Progressing toward goal Pt will Ambulate: with modified independence;with least restrictive assistive device;16 - 50 feet PT Goal: Ambulate - Progress: Progressing toward goal Pt will Perform Home Exercise Program: with min assist PT Goal: Perform Home Exercise Program - Progress: Progressing toward goal  PT Treatment Precautions/Restrictions  Precautions Precautions: Fall;Knee Precaution Booklet Issued: No Required Braces or Orthoses: No Restrictions Weight Bearing Restrictions: Yes RLE Weight Bearing: Weight bearing as tolerated Mobility (including Balance) Bed Mobility Bed Mobility: Yes Supine to Sit: Not tested (comment) Supine to Sit Details (indicate cue type and reason): Pt. needed cues for technique. Sit to Supine - Left: 4: Min assist;HOB flat;With rail (for LE) Sit to Supine - Left Details (indicate cue  type and reason): Cues for technique Transfers Transfers: Yes Sit to Stand: 4: Min assist;With upper extremity assist;With armrests;From chair/3-in-1 Sit to Stand Details (indicate cue type and reason): Patient needed cues and assist for technique for knee precautions.  Needed constant cues for hand placement. Stand to Sit: 4: Min assist;With upper extremity assist;To bed Stand to Sit Details: Cues  needed for hand placement but pt. did slide right LE out without cues.   Stand Pivot Transfers: Not tested (comment) Stand Pivot Transfer Details (indicate cue type and reason): Able to take a few pivotal steps to chair.  Slightly dizzy.  Placed ice on pts knee with towel roll under heel.  Got pt. a diet coke, crackers and peanut butter.  Also called nursing to address pt. issues with her diet.   Ambulation/Gait Ambulation/Gait: Yes Ambulation/Gait Assistance: 4: Min assist Ambulation/Gait Assistance Details (indicate cue type and reason): Pt. needed cues to sequence steps and RW.  Unable to place foot flat on floor secondary to pain.  Limited by dizziness.  Ambulation Distance (Feet): 20 Feet Assistive device: Rolling walker Gait Pattern: Step-to pattern;Decreased step length - right;Decreased stance time - right;Decreased dorsiflexion - right;Right flexed knee in stance;Antalgic Gait velocity: NT Stairs: No Wheelchair Mobility Wheelchair Mobility: No  Posture/Postural Control Posture/Postural Control: No significant limitations Balance Balance Assessed: No Exercise  Total Joint Exercises Ankle Circles/Pumps: Strengthening;Right;10 reps;Supine;AROM Quad Sets: AROM;Strengthening;Right;10 reps;Supine Heel Slides: AAROM;Strengthening;Right;10 reps;Supine Hip ABduction/ADduction: AAROM;Strengthening;Right;10 reps;Supine Straight Leg Raises: AAROM;Strengthening;Right;10 reps;Supine End of Session PT - End of Session Equipment Utilized During Treatment: Gait belt Activity Tolerance: Patient  limited by pain Patient left: in bed;with call bell in reach;with family/visitor present Nurse Communication: Mobility status for ambulation General Behavior During Session: [facility] for tasks performed Cognition: [facility] for tasks performed  Placed patient in CPM at end of session at -1-55 degrees.  INGOLD,Eddis Pingleton 01/15/2011, 2:44 PM [facility] [phone] (pager)

## 2011-01-15 NOTE — Progress Notes (Signed)
PT Note Gave patient handout for knee exercises and instructed patient and friend re: exercises.  Also increased CPM to 60 degrees.  Thanks.  [facility] [phone])

## 2011-01-15 NOTE — Progress Notes (Signed)
Physical Therapy Evaluation Patient Details Name: Kaitlin Meyers MRN: [id number] DOB: 1955/10/14 Today's Date: 01/15/2011  Problem List: There is no problem list on file for this patient.   Past Medical History:  Past Medical History  Diagnosis Date  . Complication of anesthesia     difficulty awaking about 36 yrs ago  . Hypothyroidism    Past Surgical History:  Past Surgical History  Procedure Date  . Abdominal hysterectomy   . Knee arthroscopy 01/2010  . Cervical disc surgery   . Temporomandibular joint surgery 1987  . Bladder surgery     PT Assessment/Plan/Recommendation PT Assessment Clinical Impression Statement: Patient is s/p unicompartmental knee with limited mobility secondary to pain and dizziness.  Pt. feels her meds along with lack of proper food being brought have made her sick. Addressed patient's concerns with nursing and assisted her OOB.  Pt. desires to go home today so PTwill return this pm to try and progress pt. as able.   PT Recommendation/Assessment: Patient will need skilled PT in the acute care venue PT Problem List: Decreased strength;Decreased range of motion;Decreased activity tolerance;Decreased balance;Decreased mobility;Decreased knowledge of use of DME;Decreased safety awareness;Decreased knowledge of precautions;Pain PT Therapy Diagnosis : Difficulty walking;Generalized weakness;Acute pain PT Plan PT Frequency: 7X/week PT Treatment/Interventions: DME instruction;Gait training;Functional mobility training;Therapeutic activities;Therapeutic exercise;Balance training;Patient/family education PT Recommendation Follow Up Recommendations: Home health PT (HHOT recommended as well) Equipment Recommended:  (Need OT to assess shower equipment in the home.) PT Goals  Acute Rehab PT Goals PT Goal Formulation: With patient/family Time For Goal Achievement: 3 days Pt will go Supine/Side to Sit: with modified independence PT Goal: Supine/Side to Sit -  Progress: Other (comment) Pt will go Sit to Stand: with modified independence PT Goal: Sit to Stand - Progress: Other (comment) Pt will Ambulate: with modified independence;with least restrictive assistive device;16 - 50 feet PT Goal: Ambulate - Progress: Other (comment) Pt will Perform Home Exercise Program: with min assist PT Goal: Perform Home Exercise Program - Progress: Other (comment)  PT Evaluation Precautions/Restrictions  Precautions Precautions: Fall;Knee Restrictions RLE Weight Bearing: Weight bearing as tolerated Prior Functioning  Home Living Lives With: Spouse Receives Help From: Family Type of Home: House Home Layout: One level Home Access: Level entry Bathroom Shower/Tub: Walk-in Contractor: Standard Home Adaptive Equipment: Built-in shower seat;Bedside commode/3-in-1;Walker - rolling;Crutches Prior Function Level of Independence: Independent with basic ADLs;Independent with homemaking with ambulation Driving: Yes Vocation: Full time employment Comments: Regional manager/Sales Cognition Cognition Arousal/Alertness: Awake/alert Overall Cognitive Status: Appears within functional limits for tasks assessed Orientation Level: Oriented X4 Sensation/Coordination Sensation Light Touch: Appears Intact Stereognosis: Not tested Hot/Cold: Not tested Proprioception: Not tested Coordination Gross Motor Movements are Fluid and Coordinated: Yes Fine Motor Movements are Fluid and Coordinated: Yes Extremity Assessment RUE Assessment RUE Assessment: Within Functional Limits LUE Assessment LUE Assessment: Within Functional Limits RLE Assessment RLE Assessment: Exceptions to [facility] RLE AROM (degrees) Overall AROM Right Lower Extremity: Deficits;Due to precautions;Due to pain Right Knee Extension 0-130: 5  Right Knee Flexion 0-140: 85  RLE Strength RLE Overall Strength: Deficits;Due to precautions;Due to pain Right Hip Flexion: 3-/5 Right Hip  Extension: 3/5 Right Hip ABduction: 3-/5 Right Hip ADduction: 3-/5 Right Knee Flexion: 2+/5 Right Knee Extension: 2-/5 Right Ankle Dorsiflexion: 3-/5 Right Ankle Plantar Flexion: 3-/5 Right Ankle Inversion: 3-/5 Right Ankle Eversion: 3-/5 LLE Assessment LLE Assessment: Within Functional Limits Mobility (including Balance) Bed Mobility Bed Mobility: Yes Supine to Sit: 1: +2 Total assist;Patient percentage (comment);HOB elevated (Comment degrees) (  pt = 70% with HOB at 20 degrees) Supine to Sit Details (indicate cue type and reason): Pt. needed cues for technique. Transfers Transfers: Yes Sit to Stand: 1: +2 Total assist;Patient percentage (comment);From elevated surface;With upper extremity assist;From bed (pt= 70%) Sit to Stand Details (indicate cue type and reason): Patient needed cues and assist for technique for knee precautions.  Needed constant cues for hand placement.   Stand to Sit: 1: +2 Total assist;Patient percentage (comment);With upper extremity assist;With armrests;To chair/3-in-1 (pt.= 70%) Stand to Sit Details: Cues  needed for hand placement but pt. did slide right LE out without cues.   Stand Pivot Transfers: 1: +2 Total assist;Patient percentage (comment) (pt= 70%) Stand Pivot Transfer Details (indicate cue type and reason): Able to take a few pivotal steps to chair.  Slightly dizzy.  Placed ice on pts knee with towel roll under heel.  Got pt. a diet coke, crackers and peanut butter.  Also called nursing to address pt. issues with her diet.   Ambulation/Gait Ambulation/Gait: No Stairs: No Wheelchair Mobility Wheelchair Mobility: No  Posture/Postural Control Posture/Postural Control: No significant limitations Balance Balance Assessed: No Exercise  Total Joint Exercises Ankle Circles/Pumps: AROM;Strengthening;Both;15 reps;Seated Quad Sets: AROM;Strengthening;Both;15 reps;Seated End of Session PT - End of Session Equipment Utilized During Treatment: Gait  belt Activity Tolerance: Patient limited by pain Patient left: in chair;with call bell in reach;with family/visitor present Nurse Communication: Mobility status for ambulation General Behavior During Session: [facility] for tasks performed Cognition: [facility] for tasks performed  INGOLD,Adlene Adduci 01/15/2011, 12:02 PM [facility] [phone] (pager)

## 2011-01-15 NOTE — Progress Notes (Signed)
PATIENT ID:      Kaitlin Meyers  MRN:     [id number] DOB/AGE:    [birth year] / 55 y.o.    PROGRESS NOTE Subjective:  negative for Chest Pain  negative for Shortness of Breath  negative for Nausea/Vomiting   negative for Calf Pain  negative for Bowel Movement   Tolerating Diet: yes         Patient reports pain as 3 on 0-10 scale.    Objective: Vital signs in last 24 hours:  Patient Vitals for the past 24 hrs:  BP Temp Temp src Pulse Resp SpO2  01/15/11 0532 102/66 mmHg 97.2 F (36.2 C) Oral 64  16  96 %  01/15/11 0300 115/72 mmHg 98.3 F (36.8 C) Oral 67  16  99 %  01/14/11 2153 105/67 mmHg 97.3 F (36.3 C) Oral 74  18  96 %  01/14/11 1845 116/55 mmHg 97.2 F (36.2 C) - 71  16  100 %  01/14/11 1819 - 97.6 F (36.4 C) - 81  15  100 %  01/14/11 1810 128/77 mmHg - - 73  8  98 %  01/14/11 1800 - - - 78  13  98 %  01/14/11 1756 131/68 mmHg - - - - -  01/14/11 1745 - - - 72  9  98 %  01/14/11 1741 126/65 mmHg - - - - -  01/14/11 1730 - - - 76  11  99 %  01/14/11 1726 124/66 mmHg - - - - -  01/14/11 1710 125/70 mmHg - - 70  9  97 %  01/14/11 1655 122/75 mmHg - - 71  10  99 %  01/14/11 1640 132/77 mmHg - - 72  8  98 %  01/14/11 1630 - - - 77  - -  01/14/11 1625 137/80 mmHg - - 87  16  100 %  01/14/11 1615 - - - 79  - -  01/14/11 1610 127/75 mmHg - - 76  12  97 %  01/14/11 1600 - 98.1 F (36.7 C) - 77  - -  01/14/11 1555 120/67 mmHg - - 77  12  96 %  01/14/11 1545 - - - 79  - -  01/14/11 1540 123/74 mmHg - - 79  12  96 %  01/14/11 1535 - - - 94  14  98 %  01/14/11 1530 117/68 mmHg - - 80  9  97 %  01/14/11 1525 117/68 mmHg - - 82  9  97 %  01/14/11 1520 - - - 81  9  97 %  01/14/11 1515 - - - 81  8  96 %  01/14/11 1510 118/72 mmHg - - 83  9  96 %  01/14/11 1505 - - - 82  10  96 %  01/14/11 1500 - - - 82  9  96 %  01/14/11 1457 126/75 mmHg - - - - 95 %  01/14/11 1455 126/75 mmHg - - 84  10  95 %  01/14/11 1452 - - - - 10  95 %  01/14/11 1450 - - - 84  10  95 %  01/14/11  1445 - - - 85  10  95 %  01/14/11 1440 131/64 mmHg - - 84  10  95 %  01/14/11 1435 - - - 85  9  95 %  01/14/11 1430 131/64 mmHg - - 87  9  97 %  01/14/11 1425 140/74 mmHg - -  93  13  96 %  01/14/11 1420 - - - 92  10  96 %  01/14/11 1415 - - - 96  11  97 %  01/14/11 1410 131/73 mmHg - - 91  11  97 %  01/14/11 1405 - - - 98  - -  01/14/11 1400 148/79 mmHg - - 99  13  98 %  01/14/11 1355 148/79 mmHg - - - - -  01/14/11 1345 - 97.7 F (36.5 C) - - - -  01/14/11 1124 - - - 114  17  100 %  01/14/11 1123 - - - 103  15  100 %  01/14/11 0917 134/90 mmHg 98.4 F (36.9 C) Oral 97  18  100 %      Intake/Output from previous day:   12/03 0701 - 12/04 0700 In: 2050 [I.V.:2050] Out: 4300 [Urine:4100]   Intake/Output this shift:       Intake/Output      12/03 0701 - 12/04 0700 12/04 0701 - 12/05 0700   I.V. 2050    Total Intake 2050    Urine 4100    Blood 200    Total Output 4300    Net -2250            LABORATORY DATA:  Basename 01/15/11 0500 01/10/11 1608  WBC 12.3* 8.2  HGB 13.5 15.3*  HCT 39.1 44.1  PLT 149* 162    Basename 01/15/11 0500 01/10/11 1608  NA 138 139  K 4.8 3.7  CL 104 101  CO2 26 27  BUN 9 17  CREATININE 0.65 0.82  GLUCOSE 125* 87  CALCIUM 8.7 9.7   Lab Results  Component Value Date   INR 0.97 01/10/2011    Examination:  General appearance: alert, cooperative and no distress Extremities: extremities normal, atraumatic, no cyanosis or edema and Homans sign is negative, no sign of DVT  Wound Exam: clean, dry, intact   Drainage:  None: wound tissue dry  Motor Exam EHL and FHL Intact  Sensory Exam Deep Peroneal normal  Assessment:    1 Day Post-Op  Procedure(s) (LRB): UNICOMPARTMENTAL KNEE (Right)  ADDITIONAL DIAGNOSIS:  Active Problems:  * No active hospital problems. *   Acute Blood Loss Anemia   Plan: Physical Therapy as ordered Weight Bearing as Tolerated (WBAT)  DVT Prophylaxis:  Lovenox  DISCHARGE PLAN: Home  DISCHARGE  NEEDS: HHPT, CPM, Walker, 3-in-1 comode seat and needs to be done today. anticipate discharge this p.m.         [name] 01/15/2011, 8:22 AM

## 2011-01-16 LAB — BASIC METABOLIC PANEL
BUN: 9 mg/dL (ref 6–23)
Chloride: 108 mEq/L (ref 96–112)
GFR calc Af Amer: >90 mL/min (ref >90)
Potassium: 3.8 mEq/L (ref 3.5–5.1)
Sodium: 139 mEq/L (ref 135–145)

## 2011-01-16 LAB — CBC
HCT: 36.5 % (ref 36.0–46.0)
Platelets: 133 10*3/uL — ABNORMAL LOW (ref 150–400)
RDW: 13.7 % (ref 11.5–15.5)
WBC: 7.6 10*3/uL (ref 4.0–10.5)

## 2011-01-16 MED ORDER — HYDROCODONE-ACETAMINOPHEN 5-325 MG PO TABS: 1.0000 | ORAL_TABLET | ORAL | Status: AC | PRN

## 2011-01-16 MED ORDER — ENOXAPARIN SODIUM 40 MG/0.4ML ~~LOC~~ SOLN: 40.0000 mg | SUBCUTANEOUS | Status: DC

## 2011-01-16 MED ORDER — ALPRAZOLAM 0.25 MG PO TABS: 0.2500 mg | ORAL_TABLET | Freq: Three times a day (TID) | ORAL | Status: AC | PRN

## 2011-01-16 MED ORDER — OXYCODONE HCL 5 MG PO TABS: 5.0000 mg | ORAL_TABLET | ORAL | Status: AC | PRN

## 2011-01-16 MED ORDER — MELOXICAM 15 MG PO TABS: 15.0000 mg | ORAL_TABLET | Freq: Every day | ORAL | Status: AC

## 2011-01-16 MED ORDER — METHOCARBAMOL 500 MG PO TABS: 500.0000 mg | ORAL_TABLET | Freq: Four times a day (QID) | ORAL | Status: AC | PRN

## 2011-01-16 NOTE — Plan of Care (Signed)
Problem: Phase II Progression Outcomes Goal: Other Phase II Outcomes/Goals Outcome: Progressing Pt completed bed mobility with Min A to hold RLE to scoot to EOB <> 3N1 transfer at bedside<>chair transfer. Pt demonstrates ability to transfer to 3N1 in restroom with staff.

## 2011-01-16 NOTE — Discharge Summary (Signed)
PATIENT ID:      Kaitlin Meyers  MRN:     [id number] DOB/AGE:    1955-08-18 / 55 y.o.     DISCHARGE SUMMARY  ADMISSION DATE:    01/14/2011 DISCHARGE DATE:   01/16/2011   ADMISSION DIAGNOSIS: osteoarthritis medial compartment right knee  (osteoarthritis medial compartment right knee)  DISCHARGE DIAGNOSIS:  osteoarthritis medial compartment right knee    ADDITIONAL DIAGNOSIS: Active Problems:  * No active hospital problems. *   Past Medical History  Diagnosis Date  . Hypothyroidism   . Complication of anesthesia ~1976    difficulty awaking  . Arthritis   . Hx of migraines 01/15/11    "I've had 2 migraines w/o pain in my life; vision issues"    PROCEDURE: Procedure(s): UNICOMPARTMENTAL KNEE on 01/14/2011  CONSULTS:     HISTORY:  See H&P in chart  HOSPITAL COURSE:  Kaitlin Meyers is a 55 y.o. admitted on 01/14/2011 and found to have a diagnosis of osteoarthritis medial compartment right knee.  After appropriate laboratory studies were obtained  they were taken to the operating room on 01/14/2011 and underwent Procedure(s): UNICOMPARTMENTAL KNEE.   They were given perioperative antibiotics:  Anti-infectives     Start     Dose/Rate Route Frequency Ordered Stop   01/15/11 0100   vancomycin (VANCOCIN) IVPB 1000 mg/200 mL premix        1,000 mg 200 mL/hr over 60 Minutes Intravenous Every 12 hours 01/14/11 2020 01/15/11 0156   01/13/11 0900   vancomycin (VANCOCIN) IVPB 1000 mg/200 mL premix        1,000 mg 200 mL/hr over 60 Minutes Intravenous 60 min pre-op 01/13/11 0856 01/14/11 1152        . Blood products given:none   The remainder of the hospital course was dedicated to ambulation and strengthening.   The patient was discharged on 2 Days Post-Op in  Good condition.   DIAGNOSTIC STUDIES: Recent vital signs: Patient Vitals for the past 24 hrs:  BP Temp Temp src Pulse Resp SpO2 Height Weight  01/16/11 0540 103/46 mmHg 98.2 F (36.8 C) Oral 79  16  100 % - -  01/15/11  2250 - - - - - - 5\' 4"  (1.626 m) 85.73 kg (189 lb)  01/15/11 2040 119/75 mmHg 98.9 F (37.2 C) Oral 88  18  100 % - -  01/15/11 1434 104/69 mmHg 97 F (36.1 C) Oral 75  16  100 % - -  01/15/11 1330 111/72 mmHg - - 85  - - - -       Recent laboratory studies:  Basename 01/15/11 0500 01/14/11 1608  WBC 12.3* 8.2  HGB 13.5 15.3*  HCT 39.1 44.1  PLT 149* 162    Basename 01/15/11 0500 01/14/2011 1608  NA 138 139  K 4.8 3.7  CL 104 101  CO2 26 27  BUN 9 17  CREATININE 0.65 0.82  GLUCOSE 125* 87  CALCIUM 8.7 9.7   Lab Results  Component Value Date   INR 0.97 01-14-11     Recent Radiographic Studies :   Chest 2 View  01/14/11  *RADIOLOGY REPORT*  Clinical Data: Preop for total right knee replacement.  No chest complaints.  CHEST - 2 VIEW  Comparison: 07/05/2005.  Findings: The heart, mediastinal, and hilar contours are normal. The lungs are well-expanded and clear. Negative for pleural effusion. The bony thorax is unremarkable.  IMPRESSION: No acute cardiopulmonary disease.  Original Report Authenticated By: Britta Mccreedy, M.D.  Dg C-arm 1-60 Min-no Report  01/14/2011  CLINICAL DATA: unicompartmental knee   C-ARM 1-60 MINUTES  Fluoroscopy was utilized by the requesting physician.  No radiographic  interpretation.      DISCHARGE INSTRUCTIONS: Discharge Orders    Future Orders Please Complete By Expires   Diet - low sodium heart healthy      Call MD / Call 911      Comments:   If you experience chest pain or shortness of breath, CALL 911 and be transported to the hospital emergency room.  If you develope a fever above 101 F, pus (white drainage) or increased drainage or redness at the wound, or calf pain, call your surgeon's office.   Constipation Prevention      Comments:   Drink plenty of fluids.  Prune juice may be helpful.  You may use a stool softener, such as Colace (over the counter) 100 mg twice a day.  Use MiraLax (over the counter) for constipation as needed.    Increase activity slowly as tolerated      Weight Bearing as taught in Physical Therapy      Comments:   Use a walker or crutches as instructed.   Patient may shower      Comments:   You may shower without a dressing once there is no drainage.  Do not wash over the wound.  If drainage remains, cover wound with plastic wrap and then shower.   Bed to Chair Transfer      Driving restrictions      Comments:   No driving for 6 weeks   Lifting restrictions      Comments:   No lifting for 6 weeks   CPM      Comments:   Continuous passive motion machine (CPM):      Use the CPM from 0 to 90 for 6-8 hours per day.      You may increase by 10 per day.  You may break it up into 2 or 3 sessions per day.      Use CPM for 2 weeks or until you are told to stop.   TED hose      Comments:   Use stockings (TED hose) for 3 weeks on both leg(s).  You may remove them at night for sleeping.   Change dressing      Comments:   Change dressing on thursday, then change the dressing daily with sterile 4 x 4 inch gauze dressing and apply TED hose.  You may clean the incision with alcohol prior to redressing.   Do not put a pillow under the knee. Place it under the heel.         DISCHARGE MEDICATIONS:  Current Discharge Medication List    START taking these medications   Details  ALPRAZolam (XANAX) 0.25 MG tablet Take 1 tablet (0.25 mg total) by mouth 3 (three) times daily as needed for anxiety. Qty: 30 tablet, Refills: 0    enoxaparin (LOVENOX) 40 MG/0.4ML SOLN Inject 0.4 mLs (40 mg total) into the skin daily. Qty: 10 Syringe, Refills: 0    HYDROcodone-acetaminophen (NORCO) 5-325 MG per tablet Take 1-2 tablets by mouth every 4 (four) hours as needed. Qty: 60 tablet, Refills: 0    meloxicam (MOBIC) 15 MG tablet Take 1 tablet (15 mg total) by mouth daily. Qty: 30 tablet, Refills: 0    methocarbamol (ROBAXIN) 500 MG tablet Take 1 tablet (500 mg total) by mouth every 6 (six) hours as needed.  Qty: 60  tablet, Refills: 0    oxyCODONE (OXY IR/ROXICODONE) 5 MG immediate release tablet Take 1 tablet (5 mg total) by mouth every 4 (four) hours as needed. Qty: 60 tablet, Refills: 0      CONTINUE these medications which have NOT CHANGED   Details  estradiol (VIVELLE-DOT) 0.025 MG/24HR Place 1 patch onto the skin 2 (two) times a week. Sunday and wednesday     fluticasone (VERAMYST) 27.5 MCG/SPRAY nasal spray Place 1 spray into the nose daily as needed. For allergies     levothyroxine (LEVOXYL) 100 MCG tablet Take 100 mcg by mouth daily.      loratadine-pseudoephedrine (CLARITIN-D 24-HOUR) 10-240 MG per 24 hr tablet Take 1 tablet by mouth daily.      cholecalciferol (VITAMIN D) 1000 UNITS tablet Take 1,000 Units by mouth daily.          FOLLOW UP VISIT:   Follow-up Information    Follow up with Raymon Mutton, MD. Call on 01/25/2011.   Contact information:   [address] [place] [address] [phone]          DISPOSITION:  Home    CONDITION:  Good   Lailana Shira 01/16/2011, 7:51 AM

## 2011-01-16 NOTE — Progress Notes (Signed)
PATIENT ID:      Kaitlin Meyers  MRN:     [id number] DOB/AGE:    56-Feb-1957 / 56 y.o..o.    PROGRESS NOTE Subjective:  negative for Chest Pain  negative for Shortness of Breath  negative for Nausea/Vomiting   negative for Calf Pain  negative for Bowel Movement   Tolerating Diet: yes         Patient reports pain as 4 on 0-10 scale.    Objective: Vital signs in last 24 hours:  Patient Vitals for the past 24 hrs:  BP Temp Temp src Pulse Resp SpO2 Height Weight  01/16/11 0540 103/46 mmHg 98.2 F (36.8 C) Oral 79  16  100 % - -  01/15/11 2250 - - - - - - 5\' 4"  (1.626 m) 85.73 kg (189 lb)  01/15/11 2040 119/75 mmHg 98.9 F (37.2 C) Oral 88  18  100 % - -  01/15/11 1434 104/69 mmHg 97 F (36.1 C) Oral 75  16  100 % - -  01/15/11 1330 111/72 mmHg - - 85  - - - -      Intake/Output from previous day:   12/04 0701 - 12/05 0700 In: 270 [P.O.:270] Out: -    Intake/Output this shift:       Intake/Output      12/04 0701 - 12/05 0700 12/05 0701 - 12/06 0700   P.O. 270    I.V. (mL/kg)     Total Intake(mL/kg) 270 (3.1)    Urine (mL/kg/hr)     Blood     Total Output     Net +270            LABORATORY DATA:  Basename 01/15/11 0500 01/10/11 1608  WBC 12.3* 8.2  HGB 13.5 15.3*  HCT 39.1 44.1  PLT 149* 162    Basename 01/15/11 0500 01/10/11 1608  NA 138 139  K 4.8 3.7  CL 104 101  CO2 26 27  BUN 9 17  CREATININE 0.65 0.82  GLUCOSE 125* 87  CALCIUM 8.7 9.7   Lab Results  Component Value Date   INR 0.97 01/10/2011    Examination:  General appearance: alert, cooperative and no distress Extremities: extremities normal, atraumatic, no cyanosis or edema and Homans sign is negative, no sign of DVT  Wound Exam: clean, dry, intact   Drainage:  None: wound tissue dry  Motor Exam EHL and FHL Intact  Sensory Exam Deep Peroneal normal  Assessment:    2 Days Post-Op  Procedure(s) (LRB): UNICOMPARTMENTAL KNEE (Right)  ADDITIONAL DIAGNOSIS:  Active Problems:  * No  active hospital problems. *   Acute Blood Loss Anemia   Plan: Physical Therapy as ordered Weight Bearing as Tolerated (WBAT)  DVT Prophylaxis:  Lovenox  DISCHARGE PLAN: Home  DISCHARGE NEEDS: HHPT, CPM, Walker and 3-in-1 comode seat         Malaney Mcbean 01/16/2011, 7:40 AM

## 2011-01-16 NOTE — Progress Notes (Signed)
Physical Therapy Treatment Patient Details Name: Kaitlin Meyers MRN: [id number] DOB: 15-Mar-1955 Today's Date: 01/16/2011  PT Assessment/Plan  PT - Assessment/Plan Comments on Treatment Session: Pt progressing very well. She is becoming more confident with mobility. Will see patient again this afternoon before DC home (per MD note) PT Plan: Discharge plan remains appropriate PT Frequency: 7X/week Follow Up Recommendations: Home health PT Equipment Recommended: None recommended by PT PT Goals  Acute Rehab PT Goals PT Goal: Supine/Side to Sit - Progress: Progressing toward goal PT Goal: Sit to Stand - Progress: Progressing toward goal PT Goal: Ambulate - Progress: Progressing toward goal PT Goal: Perform Home Exercise Program - Progress: Progressing toward goal  PT Treatment Precautions/Restrictions  Precautions Precautions: Fall;Knee Precaution Booklet Issued: No Required Braces or Orthoses: No Restrictions Weight Bearing Restrictions: Yes RLE Weight Bearing: Weight bearing as tolerated Mobility (including Balance) Bed Mobility Supine to Sit: 4: Min assist;With rails Supine to Sit Details (indicate cue type and reason): A for RLE. Cues for positioning and technique.  Sit to Supine - Left: 4: Min assist;With rail Sit to Supine - Left Details (indicate cue type and reason): A for RLE Cues for positioning.  Transfers Transfers: Yes Sit to Stand: 5: Supervision;With armrests;From bed;From chair/3-in-1;With upper extremity assist Sit to Stand Details (indicate cue type and reason): Cues for safe hand placement.  Stand to Sit: 5: Supervision;With armrests;To bed;To chair/3-in-1;With upper extremity assist Stand to Sit Details: Cues for safe hand placement and to slide out RLE Ambulation/Gait Ambulation/Gait: Yes Ambulation/Gait Assistance: Other (comment) (MinGuard A) Ambulation/Gait Assistance Details (indicate cue type and reason): Cues for gait sequence and RW  management Ambulation Distance (Feet): 45 Feet Assistive device: Rolling walker Gait Pattern: Step-to pattern;Decreased stride length;Antalgic    Exercise  Total Joint Exercises Ankle Circles/Pumps: AROM;Right;10 reps Quad Sets: AROM;Strengthening;Right;10 reps Heel Slides: AAROM;Strengthening;Right;10 reps Hip ABduction/ADduction: AAROM;Strengthening;Right;10 reps Straight Leg Raises: AAROM;Strengthening;Right;10 reps End of Session PT - End of Session Equipment Utilized During Treatment: Gait belt Activity Tolerance: Patient tolerated treatment well Patient left: in chair;in CPM;with call bell in reach;with family/visitor present Nurse Communication: Mobility status for transfers;Mobility status for ambulation General Behavior During Session: Kaitlin Meyers for tasks performed Cognition: Kaitlin Meyers for tasks performed  Kaitlin Meyers 01/16/2011, 10:25 AM 01/16/2011 Kaitlin Meyers PTA [phone] pager [phone] office

## 2011-01-16 NOTE — Progress Notes (Signed)
Pt discharge instructions reviewed with patient and family. All questions answered. Dressing care and incision care reviewed with pt and family. Prescriptions and med rec. reviewed with pt. and family. Pain on discharge is a 2. Dressing is dry and intact. Follow up md apt discussed. lovenox teaching done. Pt and family verabalize understanding of all. Pt discharged home with family friend.  equipment and home health set up before dc. equipment already at pt's home. Laure Kidney [place]

## 2011-01-16 NOTE — Progress Notes (Signed)
Occupational Therapy Evaluation Patient Details Name: Kaitlin Meyers MRN: [id number] DOB: 09-15-1955 Today's Date: 01/16/2011  Problem List: There is no problem list on file for this patient.   Past Medical History:  Past Medical History  Diagnosis Date  . Hypothyroidism   . Complication of anesthesia ~1976    difficulty awaking  . Arthritis   . Hx of migraines 01/15/11    "I've had 2 migraines w/o pain in my life; vision issues"   Past Surgical History:  Past Surgical History  Procedure Date  . Knee arthroscopy 01/2010    right  . Cervical disc surgery 2007  . Temporomandibular joint surgery 1987  . Bladder surgery 2011    "mesh"  . 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 Vaginal hysterectomy 2011    OT Assessment/Plan/Recommendation OT Assessment Clinical Impression Statement: Pt presents s/p R TKA see problem list and therapy diagnosis listed below. Pt will benefit from skilled OT in the acute setting to maximize independence to perform bed mobility MOD I,.basic transfer MoD I to decr burden of care at d/c home. OT Recommendation/Assessment: Patient will need skilled OT in the acute care venue OT Problem List: Decreased activity tolerance;Impaired balance (sitting and/or standing);Decreased safety awareness;Decreased knowledge of use of DME or AE;Pain OT Therapy Diagnosis : Acute pain OT Plan OT Frequency: Min 2X/week OT Treatment/Interventions: Self-care/ADL training;Energy conservation;Therapeutic activities;Patient/family education;Balance training OT Recommendation Follow Up Recommendations: Home health OT Equipment Recommended: Defer to next venue Individuals Consulted Consulted and Agree with Results and Recommendations: Patient;Family member/caregiver OT Goals Acute Rehab OT Goals OT Goal Formulation: With patient Time For Goal Achievement: 7 days ADL Goals Pt Will Transfer to Toilet: with modified  independence;Stand pivot transfer;3-in-1 Pt Will Perform Toileting - Clothing Manipulation: with modified independence;Standing Pt Will Perform Toileting - Hygiene: with modified independence;Sit to stand from 3-in-1/toilet Pt Will Perform Tub/Shower Transfer: with modified independence;Stand pivot transfer;Ambulation;with DME  OT Evaluation Precautions/Restrictions  Precautions Precautions: Fall;Knee Precaution Booklet Issued: No Required Braces or Orthoses: No Restrictions Weight Bearing Restrictions: Yes RLE Weight Bearing: Weight bearing as tolerated Prior Functioning Home Living Lives With: Spouse Receives Help From: Family Type of Home: House Home Layout: One level Home Access: Level entry Bathroom Shower/Tub: Walk-in Contractor: Standard Bathroom Accessibility: Yes How Accessible: Accessible via walker Home Adaptive Equipment: Built-in shower seat;Bedside commode/3-in-1;Walker - rolling;Crutches Prior Function Level of Independence: Independent with basic ADLs;Independent with homemaking with ambulation;Independent with homemaking with wheelchair;Independent with gait;Independent with transfers Driving: Yes Vocation: Full time employment ADL ADL Eating/Feeding: Performed;Modified independent Where Assessed - Eating/Feeding: Chair Lower Body Bathing: Simulated;Maximal assistance Where Assessed - Lower Body Bathing: Sitting, chair (pt unable to reach feet and plans to have husband A with ADL) Lower Body Dressing: Maximal assistance Where Assessed - Lower Body Dressing: Sit to stand from chair (Plans to have husband A with ADL) Toilet Transfer: Performed;Minimal assistance Toilet Transfer Details (indicate cue type and reason): pt transfered to 3N1 at bedside, mod v/c to extend RLE, mod v/c for hand placement and safety, pt educated on ability to ambulate to restroom now with staff Toilet Transfer Method: Ambulating;Stand pivot Education administrator: Set designer - Clothing Manipulation: Performed Where Assessed - Glass blower/designer Manipulation: Sit to stand from 3-in-1 or toilet Toileting - Hygiene: Performed;Supervision/safety Where Assessed - Toileting Hygiene: Sit to stand from 3-in-1 or toilet Equipment Used: Theatre stage manager (educated on [employer] plans to think about purchasing) ADL Comments: pt plans to have  husband and friend A with BADL and consider AE later on Vision/Perception  Vision - History Baseline Vision: No visual deficits Patient Visual Report: No change from baseline Cognition Cognition Arousal/Alertness: Awake/alert Overall Cognitive Status: Appears within functional limits for tasks assessed Orientation Level: Oriented X4 Cognition - Other Comments: pt c/o "swimmy headed" during session. pt states "i think i need to spread out my medication and not take them both at the same time" Sensation/Coordination Sensation Light Touch: Appears Intact Stereognosis: Not tested Hot/Cold: Not tested Proprioception: Not tested Coordination Gross Motor Movements are Fluid and Coordinated: Yes Fine Motor Movements are Fluid and Coordinated: Yes Extremity Assessment RUE Assessment RUE Assessment: Within Functional Limits LUE Assessment LUE Assessment: Within Functional Limits Mobility  Bed Mobility Bed Mobility: Yes Supine to Sit: 4: Min assist;HOB flat;With rails Supine to Sit Details (indicate cue type and reason): pt educated on using rolled sheet as leg lifter. Pt able to position self on the EOB. OT holding RLE to scoot to EOB and place bil LE on floor Sit to Supine - Left: 4: Min assist;With rail Sit to Supine - Left Details (indicate cue type and reason): A for RLE Cues for positioning.  Transfers Transfers: Yes Sit to Stand: 5: Supervision;With upper extremity assist;From bed Sit to Stand Details (indicate cue type and reason): Cues for safe hand placement.  Stand to Sit: 4: Min  assist;With upper extremity assist;To chair/3-in-1 (A holding RLE and Mod v/c to extend RLE) Stand to Sit Details: Cues for safe hand placement and to slide out RLE End of Session OT - End of Session Equipment Utilized During Treatment: Gait belt Activity Tolerance: Patient tolerated treatment well Patient left: in chair;with call bell in reach;with family/visitor present pt with ice applied to RLE Nurse Communication: Mobility status for transfers;Mobility status for ambulation (educated RN Herbalist) of pt request to spread out medication) General Behavior During Session: [facility] for tasks performed Cognition: [facility] for tasks performed  Next session: perform basic transfer to shower and toilet in bathroom   Lucile Shutters 01/16/2011, 12:56 PM  Pager: [phone]

## 2011-01-16 NOTE — Progress Notes (Signed)
Physical Therapy Treatment Patient Details Name: Kaitlin Meyers MRN: [id number] DOB: 03/23/1955 Today's Date: 01/16/2011  PT Assessment/Plan  PT - Assessment/Plan Comments on Treatment Session: Pt continuing to progress with ambulation. Quality of movement much better than this mornings session. Pt with decreased pain. Discussed HEP and car transfer with patient and her friend.  PT Plan: Discharge plan remains appropriate PT Frequency: 7X/week Follow Up Recommendations: Home health PT Equipment Recommended: None recommended by PT PT Goals  Acute Rehab PT Goals PT Goal: Supine/Side to Sit - Progress: Not met PT Goal: Sit to Stand - Progress: Progressing toward goal PT Goal: Ambulate - Progress: Progressing toward goal PT Goal: Perform Home Exercise Program - Progress: Not met  PT Treatment Precautions/Restrictions  Precautions Precautions: Fall;Knee Precaution Booklet Issued: No Required Braces or Orthoses: No Restrictions Weight Bearing Restrictions: Yes RLE Weight Bearing: Weight bearing as tolerated Mobility (including Balance) Bed Mobility Bed Mobility: No Supine to Sit: 4: Min assist;HOB flat;With rails Supine to Sit Details (indicate cue type and reason): pt educated on using rolled sheet as leg lifter. Pt able to position self on the EOB. OT holding RLE to scoot to EOB and place bil LE on floor Sit to Supine - Left: 4: Min assist;With rail Sit to Supine - Left Details (indicate cue type and reason): A for RLE Cues for positioning.  Transfers Transfers: Yes Sit to Stand: 5: Supervision;With upper extremity assist;From chair/3-in-1 Sit to Stand Details (indicate cue type and reason): Cues for safe hand placement.  Stand to Sit: 5: Supervision;To chair/3-in-1;With armrests;With upper extremity assist Stand to Sit Details: Cues to slide out RLE Ambulation/Gait Ambulation/Gait: Yes Ambulation/Gait Assistance: 5: Supervision Ambulation/Gait Assistance Details (indicate  cue type and reason): Cues for posture with RW.  Ambulation Distance (Feet): 90 Feet Assistive device: Rolling walker Gait Pattern: Step-to pattern;Decreased stride length;Antalgic Gait velocity: decreased Stairs: No Wheelchair Mobility Wheelchair Mobility: No    Exercise  Total Joint Exercises Ankle Circles/Pumps: AROM;Right;10 reps Quad Sets: AROM;Strengthening;Right;10 reps Heel Slides: AAROM;Strengthening;Right;10 reps Hip ABduction/ADduction: AAROM;Strengthening;Right;10 reps Straight Leg Raises: AAROM;Strengthening;Right;10 reps End of Session PT - End of Session Equipment Utilized During Treatment: Gait belt Activity Tolerance: Patient tolerated treatment well Patient left: in chair;with family/visitor present;with call bell in reach Nurse Communication: Mobility status for transfers;Mobility status for ambulation General Behavior During Session: [facility] for tasks performed Cognition: [facility] for tasks performed  Alvie Speltz, Adline Potter 01/16/2011, 2:15 PM 01/16/2011 Fredrich Birks PTA [phone] pager [phone] office

## 2011-01-17 ENCOUNTER — Encounter (HOSPITAL_COMMUNITY): Payer: Self-pay | Admitting: Orthopedic Surgery

## 2011-02-07 ENCOUNTER — Ambulatory Visit (HOSPITAL_COMMUNITY): Payer: BC Managed Care – PPO

## 2011-02-08 ENCOUNTER — Ambulatory Visit (HOSPITAL_COMMUNITY)
Admission: RE | Admit: 2011-02-08 | Discharge: 2011-02-08 | Disposition: A | Discharge status: Home / Self Care | Payer: BC Managed Care – PPO | Source: Ambulatory Visit | Attending: Orthopedic Surgery | Admitting: Orthopedic Surgery

## 2011-02-08 DIAGNOSIS — IMO0001 Reserved for inherently not codable concepts without codable children: Secondary | ICD-10-CM | POA: Insufficient documentation

## 2011-02-08 DIAGNOSIS — M25569 Pain in unspecified knee: Secondary | ICD-10-CM | POA: Insufficient documentation

## 2011-02-08 DIAGNOSIS — M2560 Stiffness of unspecified joint, not elsewhere classified: Secondary | ICD-10-CM | POA: Insufficient documentation

## 2011-02-08 DIAGNOSIS — R269 Unspecified abnormalities of gait and mobility: Secondary | ICD-10-CM | POA: Insufficient documentation

## 2011-02-08 DIAGNOSIS — R262 Difficulty in walking, not elsewhere classified: Secondary | ICD-10-CM | POA: Insufficient documentation

## 2011-02-08 DIAGNOSIS — M6281 Muscle weakness (generalized): Secondary | ICD-10-CM | POA: Insufficient documentation

## 2011-02-08 DIAGNOSIS — M25669 Stiffness of unspecified knee, not elsewhere classified: Secondary | ICD-10-CM | POA: Insufficient documentation

## 2011-02-08 NOTE — Progress Notes (Signed)
Physical Therapy Evaluation  Patient Details  Name: Kaitlin Meyers MRN: [id number] Date of Birth: 04/23/1955  Today's Date: 02/08/2011 Time: 10:59-11:45 44 mins Charges: 1 EV, 15 TE,  Visit#: 1 of 28 Re-eval: 03/10/11  Assessment Diagnosis: R partial TKA Surgical Date: 01/14/11 Next MD Visit: 02/22/11 Prior Therapy: HHPT  Past Medical History:  Past Medical History  Diagnosis Date  . Hypothyroidism   . Complication of anesthesia ~1976    difficulty awaking  . Arthritis   . Hx of migraines 01/15/11    "I've had 2 migraines w/o pain in my life; vision issues"   Past Surgical History:  Past Surgical History  Procedure Date  . Knee arthroscopy 01/2010    right  . Cervical disc surgery 2007  . Temporomandibular joint surgery 1987  . Bladder surgery 2011    "mesh"  . 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 Vaginal hysterectomy 2011  . Partial knee arthroplasty 01/14/2011    Procedure: UNICOMPARTMENTAL KNEE;  Surgeon: Raymon Mutton, MD;  Location: [facility] OR;  Service: Orthopedics;  Laterality: Right;    Subjective Symptoms/Limitations Symptoms: 01/14/11 s/p partial TKA (medial side) Limitations: Standing;Lifting;Walking;House hold activities (squatting low, careful on the stairs, picking up things ) How long can you sit comfortably?: ok sitting still How long can you stand comfortably?: 30 mins How long can you walk comfortably?: walking with cane 30 mins Repetition: Decreases Symptoms Pain Assessment Currently in Pain?: Yes Pain Score:   3 Pain Location: Knee Pain Orientation: Right Pain Type: Surgical pain Pain Onset: 1 to 4 weeks ago Pain Frequency: Intermittent Pain Relieving Factors: movement, tylenol, ice Effect of Pain on Daily Activities: limiting her ability to drive, squat down to get pots and pans, walk normally.   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    Assessment RLE AROM (degrees) Right Knee Extension 0-130: 0  Right Knee Flexion 0-140: 123  RLE Strength Right Hip Flexion: 5/5 Right Hip Extension: 4/5 Right Hip ABduction: 5/5 Right Hip ADduction: 5/5 Right Knee Flexion: 4/5 Right Knee Extension: 4/5 Right Ankle Dorsiflexion: 5/5 Right Ankle Plantar Flexion: 5/5 LLE AROM (degrees) Left Knee Extension 0-130: -3  (hyper extension) Left Knee Flexion 0-140: 131  LLE Strength Left Hip Extension: 4/5 (all others WFL)  Exercise/Treatments Aerobic Stationary Bike: 5'  2.0 mph Standing Terminal Knee Extension: Right;10 reps;Limitations Terminal Knee Extension Limitations: against yellow ball 5" holds.   Forward Step Up: 10 reps;Hand Hold: 2;Step Height: 4" Gait Training: >1,000' without cane working on good heel to toe gait pattern Other Standing Knee Exercises: Retro gait 2 RT  Physical Therapy Assessment and Plan Clinical Impression Statement : 55 y.o. female presents s/p R partial TKA. She has decreased R knee strength, decreased R knee ROM, abnormal gait pattern, decreased balance and functional mobility and would benefit from skilled PT to address those deficits.  PT Plan: continue 3xs/wk x 4 weeks and then 2xs/wk x4 more weeks for a total of 8 weeks. Patient to bring in her HEP from her [facility]  therapist for [place] to review and delete/add exercises to next treatment. Continue with PRE. Add PROM flex and ext next treatment session. Continue gait  training without a cane.   Goals Home Exercise Program Pt will Perform Home Exercise Program: Independently PT Short Term Goals Time to Complete Short Term Goals: 4 weeks PT Short Term Goal 1: Patient's daily pain 0/10 right knee with max weekly pain 3/10 R knee for improved QOL PT Short Term Goal 2: Patient will increase knee strength to 5/5 to show improved strength and symmetry between her legs.   PT Short Term Goal 3: Patient will be able to ambulate indoor household distances without an assistive device and greater than 4.4 ft/sec gait speed to show safety with gait without a device.   PT Long Term Goals Time to Complete Long Term Goals: 8 weeks PT Long Term Goal 1: Patient will report 0/10 pain level R knee for improved QOL PT Long Term Goal 2: Patient will improve her R knee ROM to at least -2-128 degrees of flexion to make her knee more symmetrical with her other knee.   Long Term Goal 3: The pateint will be able to go up and down a fligt of stairs reciprocally without handrails to show improved strength mobility and balance.   Long Term Goal 4: The patient will be able to pick up a box from the floor with correct form to simulate getting a pot out of the bottom cabinent without difficutly or pain.   PT Long Term Goal 5: The patient will be ablet to get on and off of the floor without assistance to show improved strength and functional mobility.    Problem List Patient Active Problem List  Diagnoses  . Abnormality of gait  . Pain in joint, lower leg  . Muscle weakness (generalized)  . Limitation of joint motion   Izreal Kock B. Richard Ritchey, PT, DPT  02/08/2011, 5:07 PM  Physician Documentation Your signature is required to indicate approval of the treatment plan as stated above.  Please sign and either send electronically or make a copy of this report for your files and return this physician signed original.   Please mark one 1.__approve of plan  2. ___approve of plan with the  following conditions.   ______________________________                                                          _____________________ Physician Signature                                                                                                             Date

## 2011-02-13 ENCOUNTER — Ambulatory Visit (HOSPITAL_COMMUNITY): Payer: BC Managed Care – PPO

## 2011-02-14 ENCOUNTER — Ambulatory Visit (HOSPITAL_COMMUNITY)
Admission: RE | Admit: 2011-02-14 | Discharge: 2011-02-14 | Disposition: A | Discharge status: Home / Self Care | Payer: BC Managed Care – PPO | Source: Ambulatory Visit | Attending: Family Medicine | Admitting: Family Medicine

## 2011-02-14 DIAGNOSIS — M25669 Stiffness of unspecified knee, not elsewhere classified: Secondary | ICD-10-CM | POA: Insufficient documentation

## 2011-02-14 DIAGNOSIS — M6281 Muscle weakness (generalized): Secondary | ICD-10-CM

## 2011-02-14 DIAGNOSIS — M25569 Pain in unspecified knee: Secondary | ICD-10-CM | POA: Insufficient documentation

## 2011-02-14 DIAGNOSIS — R269 Unspecified abnormalities of gait and mobility: Secondary | ICD-10-CM

## 2011-02-14 DIAGNOSIS — M2560 Stiffness of unspecified joint, not elsewhere classified: Secondary | ICD-10-CM

## 2011-02-14 DIAGNOSIS — IMO0001 Reserved for inherently not codable concepts without codable children: Secondary | ICD-10-CM | POA: Insufficient documentation

## 2011-02-14 DIAGNOSIS — R262 Difficulty in walking, not elsewhere classified: Secondary | ICD-10-CM | POA: Insufficient documentation

## 2011-02-14 NOTE — Progress Notes (Signed)
Physical Therapy Treatment Patient Details  Name: Kaitlin Meyers MRN: [id number] Date of Birth: 01/04/1956  Today's Date: 02/14/2011 Time: 4098-1191 Time Calculation (min): 44 min Visit#: 2  of 28   Re-eval: 03/10/11  TIME: [phone] TE    Subjective: Symptoms/Limitations Symptoms: Pt c/o of more tightness of R knee than pain, however R knee does ache in night waking pt up with 3/10 pain. Pt has c/o of R sciatica pain since surgery with increasing pain-pt will call doctor to address this issue Pain Assessment Currently in Pain?:  (no pain just R knee stiffness)  Precautions/Restrictions     Exercise/Treatments Mobility/Balance        Stretches   Aerobic Stationary Bike: 7' 2.0 mph Machines for Strengthening   Plyometrics   Standing Theraband Level (Terminal Knee Extension): Level 4 (Blue) (10 x2) Lateral Step Up: 10 reps;Step Height: 4" x2 Forward Step Up: 10 reps;Hand Hold: 2;Step Height: 4" Gait Training: >1,000' without cane working on good heel to toe gait pattern Seated   Supine Straight Leg Raises: Right;10 reps;Other (comment) (all directions 10 x2) Knee Flexion: PROM;Right (2x 30secs) Sidelying   Prone         Physical Therapy Assessment and Plan PT Assessment and Plan Clinical Impression Statement: Pt completed all exercises well and performed gait training >1000' without cane; no LOB; still decreased stance time on RLE. R knee PROM 0-126 degrees. Added SLR-ABD/ADD/EXT. Pt forgot to bring in HEP to review will do at next appointment    Goals    Problem List Patient Active Problem List  Diagnoses  . Abnormality of gait  . Pain in joint, lower leg  . Muscle weakness (generalized)  . Limitation of joint motion    General Behavior During Session: [facility] for tasks performed Cognition: [facility] for tasks performed  Jaevon Paras ATKINSO 02/14/2011, 10:28 AM

## 2011-02-15 ENCOUNTER — Ambulatory Visit (HOSPITAL_COMMUNITY)
Admission: RE | Admit: 2011-02-15 | Discharge: 2011-02-15 | Disposition: A | Discharge status: Home / Self Care | Payer: BC Managed Care – PPO | Source: Ambulatory Visit | Attending: Family Medicine | Admitting: Family Medicine

## 2011-02-15 DIAGNOSIS — M2560 Stiffness of unspecified joint, not elsewhere classified: Secondary | ICD-10-CM

## 2011-02-15 DIAGNOSIS — M25569 Pain in unspecified knee: Secondary | ICD-10-CM

## 2011-02-15 DIAGNOSIS — R269 Unspecified abnormalities of gait and mobility: Secondary | ICD-10-CM

## 2011-02-15 DIAGNOSIS — M6281 Muscle weakness (generalized): Secondary | ICD-10-CM

## 2011-02-15 NOTE — Progress Notes (Signed)
Physical Therapy Treatment Patient Details  Name: ASJIA BERRIOS MRN: [id number] Date of Birth: Mar 15, 1955  Today's Date: 02/15/2011 Time: 4098-1191 Time Calculation (min): 44 min Charges: 44 TE Visit#: 3  of 28   Re-eval: 03/09/10    Subjective: Symptoms/Limitations Symptoms: "tight" Pain Assessment Currently in Pain?: No/denies Pain Score: 0-No pain Pain Location: Knee Pain Orientation: Right  Exercise/Treatments Stretches Active Hamstring Stretch: 2 reps;30 seconds (with rope) Aerobic Stationary Bike: 7' 3.0 mph Standing Heel Raises: 15 reps;Limitations Heel Raises Limitations: 15 reps toe raises Knee Flexion: 15 reps Terminal Knee Extension: 15 reps;Theraband Theraband Level (Terminal Knee Extension): Level 4 (Blue) (5" holds) Terminal Knee Extension Limitations: against yellow ball 5" holds, 15 reps.   Lateral Step Up: 15 reps;Hand Hold: 0;Step Height: 4" Forward Step Up: 15 reps;Hand Hold: 0;Step Height: 4" Step Down: Hand Hold: 1;Step Height: 4";15 reps Wall Squat: 15 reps Rocker Board: 2 minutes;Limitations Rocker Board Limitations: bil hand held assist.   SLS: multiple trials, 30" R/L Supine Quad Sets: 15 reps;Limitations Quad Sets Limitations: 5 second holds.   Short Arc [employer] Sets: 15 reps;Limitations Short Arc [employer] Limitations: 5" holds Straight Leg Raises: 15 reps Straight Leg Raise with External Rotation: 15 reps Other Supine Knee Exercises: bridges x12 Prone  Hamstring Curl: 5 seconds;15 reps Other Prone Exercises: prone quad stretch with rope 2x 30 sec   Physical Therapy Assessment and Plan  Clinical Impression Statement: Patient tolerated new exercises well.  Going to ice at home with ice machine/cuff.   PT Plan: Add weights to appropriate standing and supine exercises.  Also, do PROM missed this session.     Problem List Patient Active Problem List  Diagnoses  . Abnormality of gait  . Pain in joint, lower leg  . Muscle weakness  (generalized)  . Limitation of joint motion    PT Plan of Care PT Home Exercise Plan: HEP given this session, see scanned report.    [name] Marquetta Weiskopf, PT, DPT  02/15/2011, 10:26 AM

## 2011-02-18 ENCOUNTER — Ambulatory Visit (HOSPITAL_COMMUNITY)
Admission: RE | Admit: 2011-02-18 | Discharge: 2011-02-18 | Disposition: A | Discharge status: Home / Self Care | Payer: BC Managed Care – PPO | Source: Ambulatory Visit | Attending: Family Medicine | Admitting: Family Medicine

## 2011-02-18 DIAGNOSIS — M6281 Muscle weakness (generalized): Secondary | ICD-10-CM

## 2011-02-18 DIAGNOSIS — M25569 Pain in unspecified knee: Secondary | ICD-10-CM

## 2011-02-18 DIAGNOSIS — M2560 Stiffness of unspecified joint, not elsewhere classified: Secondary | ICD-10-CM

## 2011-02-18 DIAGNOSIS — R269 Unspecified abnormalities of gait and mobility: Secondary | ICD-10-CM

## 2011-02-18 NOTE — Progress Notes (Signed)
Physical Therapy Treatment Patient Details  Name: Kaitlin Meyers MRN: [id number] Date of Birth: 04/23/1955  Today's Date: 02/18/2011 Time: 4098-1191 Time Calculation (min): 37 min Charges: 10 neuro, 27TE Visit#: 4  of 28   Re-eval: 03/10/11    Subjective: Symptoms/Limitations Symptoms: Still walking with a cane.   Pain Assessment Currently in Pain?: No/denies ("not really") Pain Score: 0-No pain Pain Location: Knee Pain Orientation: Right Pain Type: Surgical pain  Exercise/Treatments Aerobic Stationary Bike: 7' 4.0 mph Standing Heel Raises: 20 reps Heel Raises Limitations: 15 reps toe raises Knee Flexion: 10 reps;Limitations Knee Flexion Limitations: 3# Lateral Step Up: 15 reps;Hand Hold: 0;Step Height: 4" Forward Step Up: 15 reps;Hand Hold: 0;Step Height: 4" Step Down: Step Height: 4";15 reps;Hand Hold: 0 Wall Squat: 10 reps (" i can tell I didn't take my pain killer!") Rocker Board: 2 minutes;Limitations Rocker Board Limitations: no hands SLS: multiple trials, 30" R/L Seated Long Arc Quad: 10 reps;Limitations Long Arc Quad Limitations: 3# Supine Quad Sets: 15 reps;Limitations Quad Sets Limitations: 5 second holds over towel roll.   Short Arc [employer]: 10 reps Short [employer] Sets Limitations: 3#, 5 second holds.   Straight Leg Raises: 10 reps;Limitations Straight Leg Raises Limitations: 3# Knee Flexion: PROM;Right (3x 30secs) Other Supine Knee Exercises: bridges x15 Sidelying Hip ABduction: Right;15 reps Prone  Hamstring Curl: 10 reps;Limitations Hamstring Curl Limitations: 3# Other Prone Exercises: prone quad stretch with rope 2x 30 sec  Physical Therapy Assessment and Plan PT Assessment and Plan Clinical Impression Statement: Patient is c/o most of right hip sciatic pain.   She goes to the MD to ask about getting an injection soon.   PT Plan: Assess tolerance to new ther ex with weights.  Add hamstring stretch with rope R leg next session.      Problem  List Patient Active Problem List  Diagnoses  . Abnormality of gait  . Pain in joint, lower leg  . Muscle weakness (generalized)  . Limitation of joint motion     Chevy Virgo B. Henryetta Corriveau, PT, DPT  02/18/2011, 4:43 PM

## 2011-02-20 ENCOUNTER — Ambulatory Visit (HOSPITAL_COMMUNITY)
Admission: RE | Admit: 2011-02-20 | Discharge: 2011-02-20 | Disposition: A | Discharge status: Home / Self Care | Payer: BC Managed Care – PPO | Source: Ambulatory Visit | Attending: Family Medicine | Admitting: Family Medicine

## 2011-02-20 DIAGNOSIS — R269 Unspecified abnormalities of gait and mobility: Secondary | ICD-10-CM

## 2011-02-20 DIAGNOSIS — M2560 Stiffness of unspecified joint, not elsewhere classified: Secondary | ICD-10-CM

## 2011-02-20 DIAGNOSIS — M6281 Muscle weakness (generalized): Secondary | ICD-10-CM

## 2011-02-20 DIAGNOSIS — M25569 Pain in unspecified knee: Secondary | ICD-10-CM

## 2011-02-20 NOTE — Progress Notes (Signed)
Physical Therapy Treatment Patient Details  Name: Kaitlin Meyers MRN: [id number] Date of Birth: [birth year]  Today's Date: 02/20/2011 Time: 1478-2956 Time Calculation (min): 41 min Charges: 8 mins neuro, 33 TE Visit#: 5  of 28   Re-eval: 03/10/11    Subjective: Symptoms/Limitations Symptoms: Patient reports that she saw Dr. Sherlean Foot this week and he removed the two remaining sutures and gave her a shot for her sciatica in her right hip which has completely helped her pain.   Pain Assessment Currently in Pain?: No/denies Pain Score: 0-No pain Pain Location: Knee Pain Orientation: Right Pain Type: Surgical pain  Exercise/Treatments Stretches Passive Hamstring Stretch: 2 reps;30 seconds;Limitations Passive Hamstring Stretch Limitations: with rope.   Aerobic Stationary Bike: 7' 4.0 mph  Standing Heel Raises: 20 reps Heel Raises Limitations: 20 reps toe raises Knee Flexion: 15 reps Knee Flexion Limitations: 3# Terminal Knee Extension: 15 reps;Theraband Theraband Level (Terminal Knee Extension): Level 4 (Blue) (5" holds) Terminal Knee Extension Limitations: against yellow ball 5" holds, 15 reps.   Lateral Step Up: 15 reps;Hand Hold: 0;Step Height: 4" Forward Step Up: 15 reps;Hand Hold: 0;Step Height: 4" Step Down: Step Height: 4";15 reps;Hand Hold: 0 Wall Squat: 15 reps Rocker Board: 2 minutes;Limitations Rocker Board Limitations: hands today because she has on dress shoes.   SLS: multiple trials, 30" R/L Seated Long Arc Quad: Limitations;15 reps Long Arc Quad Limitations: 3# Supine Quad Sets: 15 reps;Limitations Quad Sets Limitations: 5 second holds towel roll under heel.     Short Arc [employer]: 15 reps Short [employer] Sets Limitations: 3#, 5 second holds.   Straight Leg Raises: 10 reps Straight Leg Raises Limitations: 3# Straight Leg Raise with External Rotation: Limitations;10 reps Straight Leg Raise with External Rotation Limitations: 3# Other Supine Knee Exercises:  bridges x15 Sidelying Hip ABduction: Right;10 reps;Limitations Hip ABduction Limitations: 3# Prone  Hamstring Curl: 15 reps Hamstring Curl Limitations: 3# Hip Extension: 10 reps;Limitations Hip Extension Limitations: 3# Other Prone Exercises: prone quad stretch with rope 2x 30 sec    Physical Therapy Assessment and Plan PT Assessment and Plan Clinical Impression Statement: The patient is progressing quickly.  Now has full knee flexion ROM.  Tolerating increased weights well.  Ice PRN at home for pain and swelling, patient to increase her cardio walking and stationary biking at home to 20 mins.   PT Plan: Continue with PRE, add leg press cybex next session.  Increase knee flex weight to 5#, increase LAQ wt to 5#, keep SLR weight at 3# keep hip abduction and ext wt at 3#.  Try elliptical next session as well.       Problem List Patient Active Problem List  Diagnoses  . Abnormality of gait  . Pain in joint, lower leg  . Muscle weakness (generalized)  . Limitation of joint motion    PT - End of Session Activity Tolerance: Patient tolerated treatment well  Shala Baumbach B. Imojean Yoshino, PT, DPT  02/20/2011, 4:48 PM

## 2011-02-22 ENCOUNTER — Ambulatory Visit (HOSPITAL_COMMUNITY)
Admission: RE | Admit: 2011-02-22 | Discharge: 2011-02-22 | Disposition: A | Discharge status: Home / Self Care | Payer: BC Managed Care – PPO | Source: Ambulatory Visit

## 2011-02-22 DIAGNOSIS — M25569 Pain in unspecified knee: Secondary | ICD-10-CM

## 2011-02-22 DIAGNOSIS — M2560 Stiffness of unspecified joint, not elsewhere classified: Secondary | ICD-10-CM

## 2011-02-22 DIAGNOSIS — M6281 Muscle weakness (generalized): Secondary | ICD-10-CM

## 2011-02-22 DIAGNOSIS — R269 Unspecified abnormalities of gait and mobility: Secondary | ICD-10-CM

## 2011-02-22 NOTE — Progress Notes (Signed)
Physical Therapy Treatment Patient Details  Name: Kaitlin Meyers MRN: [id number] Date of Birth: November 27, 1955  Today's Date: 02/22/2011 Time: 1608-1700 Time Calculation (min): 52 min Visit#: 6  of 28   Re-eval: 03/10/11  Charge: therex 41 min  Subjective: Symptoms/Limitations Symptoms: No real pain the incision is a little irritated from pants rubbing on it. Pain Assessment Currently in Pain?: No/denies  Objective:   Exercise/Treatments Aerobic Stationary Bike: 7' 4.0 mph Elliptical: 6' L2 for endurance at end of session Machines for Strengthening Cybex Leg Press: 2.5 Pl 2x 10 Standing Heel Raises: Limitations Heel Raises Limitations: Heel/toe walk Knee Flexion: 15 reps;Limitations Knee Flexion Limitations: 4# Lateral Step Up: Right;15 reps;Hand Hold: 1;Step Height: 6" Forward Step Up: 15 reps;Hand Hold: 0;Step Height: 6" Step Down: 10 reps;Hand Hold: 1;Step Height: 6" Wall Squat: 15 reps;10 seconds Rocker Board: 2 minutes Rocker Board Limitations: no hands SLS: L 72", R 38" max of 3 Seated Long Arc Quad: 15 reps;Weights Long Arc Quad Weight: 4 lbs. Supine Quad Sets: 15 reps;Limitations Quad Sets Limitations: 5 second holds Short Arc [employer]: 15 reps;Limitations Short Arc Quad Sets Limitations: 3#, 5 second holds.   Straight Leg Raises: 15 reps Straight Leg Raises Limitations: 3# Sidelying Hip ABduction: 15 reps Hip ABduction Limitations: 3# Hip ADduction: 15 reps Clams: 3# Prone  Hamstring Curl: 15 reps Hamstring Curl Limitations: 5# Hip Extension: 15 reps Hip Extension Limitations: 3#      Physical Therapy Assessment and Plan PT Assessment and Plan Clinical Impression Statement: Progressing well towards total treatment, able to increase weight with some mat exercises without difficulty and increased height with step up/lat/down with min cueing for control descending  Added cybex leg press without difficulty.  Pt balance improving as well, able to SLS L  72" with no HHA.  Added elliptical at end of session for improved endurance. PT Plan: Continue with PRE, continue progressing strength and balance.    Goals    Problem List Patient Active Problem List  Diagnoses  . Abnormality of gait  . Pain in joint, lower leg  . Muscle weakness (generalized)  . Limitation of joint motion    PT - End of Session Activity Tolerance: Patient tolerated treatment well General Behavior During Session: [facility] for tasks performed Cognition: [facility] for tasks performed  Juel Burrow 02/22/2011, 6:36 PM

## 2011-02-25 ENCOUNTER — Ambulatory Visit (HOSPITAL_COMMUNITY)
Admission: RE | Admit: 2011-02-25 | Discharge: 2011-02-25 | Disposition: A | Discharge status: Home / Self Care | Payer: BC Managed Care – PPO | Source: Ambulatory Visit | Attending: Family Medicine | Admitting: Family Medicine

## 2011-02-25 DIAGNOSIS — R269 Unspecified abnormalities of gait and mobility: Secondary | ICD-10-CM

## 2011-02-25 DIAGNOSIS — M2560 Stiffness of unspecified joint, not elsewhere classified: Secondary | ICD-10-CM

## 2011-02-25 DIAGNOSIS — M25569 Pain in unspecified knee: Secondary | ICD-10-CM

## 2011-02-25 DIAGNOSIS — M6281 Muscle weakness (generalized): Secondary | ICD-10-CM

## 2011-02-25 NOTE — Progress Notes (Signed)
Physical Therapy Treatment Patient Details  Name: Kaitlin Meyers MRN: [id number] Date of Birth: Nov 14, 1955  Today's Date: 02/25/2011 Time: 4098-1191 Time Calculation (min): 44 min Charges: 10 neuro, 34 TE Visit#: 7  of 28   Re-eval: 03/10/11    Subjective: Symptoms/Limitations Symptoms: Hip pain so bad last night she couldn't sleep.   Pain Assessment Currently in Pain?: Yes Pain Score: 0-No pain Pain Location: Knee Pain Orientation: Right Pain Type: Surgical pain Multiple Pain Sites: Yes   Exercise/Treatments Stretches Piriformis Stretch: 2 reps;30 seconds;Limitations Piriformis Stretch Limitations: bil Aerobic Stationary Bike: 7' 4.0 mph Machines for Strengthening Cybex Leg Press: 2.5 Pl 2x 10 R leg only Standing Heel Raises: 20 reps Heel Raises Limitations: 20 reps toe raises Knee Flexion: 15 reps;Limitations Knee Flexion Limitations: 4# Terminal Knee Extension: 15 reps;Theraband Theraband Level (Terminal Knee Extension): Level 4 (Blue) (5" holds) Lateral Step Up: Right;15 reps;Hand Hold: 1;Step Height: 6" Forward Step Up: 15 reps;Hand Hold: 0;Step Height: 6" Step Down: Hand Hold: 1;Step Height: 6";15 reps Wall Squat: 10 seconds;10 reps Rocker Board: 2 minutes Rocker Board Limitations: A/P, P/A, no hands SLS: SLS on foam R 9" Seated Long Arc Quad: 15 reps;Weights Long Arc Quad Weight: 4 lbs. Long [employer] Limitations: 5" holds Supine Bridges: Limitations Bridges Limitations: 12 reps, 3" holds.   Straight Leg Raises: 15 reps Straight Leg Raises Limitations: 3# Straight Leg Raise with External Rotation: Limitations;10 reps Straight Leg Raise with External Rotation Limitations: 3# Sidelying Hip ABduction: 15 reps Hip ABduction Limitations: 3# Hip ADduction: 15 reps Clams: 3# Prone  Hamstring Curl: 15 reps Hamstring Curl Limitations: 5# Hip Extension: 15 reps Hip Extension Limitations: 3# Other Prone Exercises: prone quad stretch with rope 2x 30  sec   Physical Therapy Assessment and Plan  Clinical Impression Statement: Patient is doing really well with her knee ther ex.  Her primary complaint is her right hip sciatica flare up, so I sent Dr. Sherlean Foot a script to sign for [place] to evaluate and stat treating her low back and sciatica in addition to her right knee.    PT Plan: Continue with PRE for knee strengthening, continue leg stretches and bridges and when we get MD order back schedule with PT to evaluate her low back.      Problem List Patient Active Problem List  Diagnoses  . Abnormality of gait  . Pain in joint, lower leg  . Muscle weakness (generalized)  . Limitation of joint motion    PT Plan of Care PT Home Exercise Plan: added bridges and piriformis stretches.  See scanned paper Consulted and Agree with Plan of Care: Patient  [name]. Brogan Martis, PT, DPT [phone] 02/25/2011, 7:21 PM

## 2011-02-27 ENCOUNTER — Ambulatory Visit (HOSPITAL_COMMUNITY)
Admission: RE | Admit: 2011-02-27 | Discharge: 2011-02-27 | Disposition: A | Discharge status: Home / Self Care | Payer: BC Managed Care – PPO | Source: Ambulatory Visit | Attending: Family Medicine | Admitting: Family Medicine

## 2011-02-27 DIAGNOSIS — M2560 Stiffness of unspecified joint, not elsewhere classified: Secondary | ICD-10-CM

## 2011-02-27 DIAGNOSIS — R269 Unspecified abnormalities of gait and mobility: Secondary | ICD-10-CM

## 2011-02-27 DIAGNOSIS — M25569 Pain in unspecified knee: Secondary | ICD-10-CM

## 2011-02-27 DIAGNOSIS — M6281 Muscle weakness (generalized): Secondary | ICD-10-CM

## 2011-02-27 NOTE — Progress Notes (Addendum)
Physical Therapy Re-Evaluation (to assess R leg sciatica and low back)  Patient Details  Name: Kaitlin Meyers MRN: [id number] Date of Birth: October 10, 1955  Today's Date: 02/27/2011 Time: 1478-2956 Time Calculation (min): 43 min Charges: 1 re-eval, 28 TE Visit#: 8  of 28   Re-eval: 03/10/11    Past Medical History:  Past Medical History  Diagnosis Date  . Hypothyroidism   . Complication of anesthesia ~1976    difficulty awaking  . Arthritis   . Hx of migraines 01/15/11    "I've had 2 migraines w/o pain in my life; vision issues"   Past Surgical History:  Past Surgical History  Procedure Date  . Knee arthroscopy 01/2010    right  . Cervical disc surgery 2007  . Temporomandibular joint surgery 1987  . Bladder surgery 2011    "mesh"  . 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 Vaginal hysterectomy 2011  . Partial knee arthroplasty 01/14/2011    Procedure: UNICOMPARTMENTAL KNEE;  Surgeon: Raymon Mutton, MD;  Location: [facility] OR;  Service: Orthopedics;  Laterality: Right;    Subjective Symptoms/Limitations Symptoms: Patient reports some relief of right hip pain with stretches given last session.  Dr. Sherlean Foot faxed over a prescription for [place] to start treating her R leg sciatica along with her right knee.   Pain Assessment Currently in Pain?: No/denies Pain Score: 0-No pain Pain Location: Knee Pain Orientation: Right  Assessment Lumbar AROM Lumbar Flexion: WFL (painful right sided pulling in low back.  ) Lumbar Extension: WFL (no pain) Lumbar - Right Side Bend: WFL (painful sensation) Lumbar - Left Side Bend: WFL (pain free) Lumbar - Right Rotation: WFL (no pain) Lumbar - Left Rotation: WFL (painful) Lumbar Strength Lumbar Flexion: 3+/5 Lumbar Extension: 3-/5  Exercise/Treatments Stretches Press Ups: Limitations Press Ups Limitations: 10 reps 3 seconds.   Piriformis Stretch: 2 reps;30 seconds;Limitations Lumbar  Exercises Machine Exercises Cybex Leg Press: 2.5 Pl 2x 10 R leg only Stationary Bike: 7' @ 5.0  Stretches Piriformis Stretch: 2 reps;30 seconds;Limitations Aerobic Stationary Bike: 7' @ 5.0 Machines for Strengthening Cybex Leg Press: 2.5 Pl 2x 10 R leg only Standing Knee Flexion: 15 reps;Limitations Knee Flexion Limitations: 5# Lateral Step Up: Right;15 reps;Hand Hold: 1;Step Height: 6" Forward Step Up: 15 reps;Hand Hold: 0;Step Height: 6" Step Down: Hand Hold: 1;Step Height: 6";15 reps Wall Squat: 15 reps;5 seconds Seated Long Arc Quad: 15 reps Long Arc Quad Weight: 5 lbs. Long [employer] Limitations: 5" holds Supine Short Arc [employer] Sets: Limitations;15 reps Short [employer] Limitations: 4#, 5 second holds.   Bridges: 15 reps Bridges Limitations: 3" Straight Leg Raises: 15 reps Straight Leg Raises Limitations: 3#, performed with ab set Straight Leg Raise with External Rotation: 15 reps Straight Leg Raise with External Rotation Limitations: 3# Sidelying Hip ABduction: 15 reps Hip ABduction Limitations: 3# Hip ADduction: 15 reps Clams: 3# Prone  Hamstring Curl: 15 reps Hamstring Curl Limitations: 5# Hip Extension: 15 reps Hip Extension Limitations: alternating legs for low back work.   Other Prone Exercises: prone quad stretch with rope 2x 30 sec  Physical Therapy Assessment and Plan  Clinical Impression Statement: Patient is doing well with increased weight.  All table and wall slides are performed with ab set to help build core strength.  Good results form piriformis stretch and bridges at home to help right sciatica pain.  Assessment of back performed today.  Will send updated note to MD.   Rehab  Potential: Good PT Frequency: Min 3X/week (3xs/wk x 4 weeks and then 2xs/wk x 4 weeks.  ) PT Duration: 8 weeks PT Treatment/Interventions: Stair training;Functional mobility training;Therapeutic activities;Therapeutic exercise;Balance training;Neuromuscular  re-education;Patient/family education;Other (comment) (modalities as needed for pain.  ) PT Plan: add prone alt arms and legs next session as well as ab set with bent knee raises.      Problem List Patient Active Problem List  Diagnoses  . Abnormality of gait  . Pain in joint, lower leg  . Muscle weakness (generalized)  . Limitation of joint motion    PT - End of Session Activity Tolerance: Patient tolerated treatment well General Behavior During Session: [facility] for tasks performed Cognition: [facility] for tasks performed PT Plan of Care PT Home Exercise Plan: bridges and piriformis stretch continue for back no new.     [name] Arnulfo Batson, PT, DPT  02/27/2011, 4:50 PM  Physician Documentation Your signature is required to indicate approval of the treatment plan as stated above.  Please sign and either send electronically or make a copy of this report for your files and return this physician signed original.   Please mark one 1.__approve of plan  2. ___approve of plan with the following conditions.   ______________________________                                                          _____________________ Physician Signature                                                                                                             Date

## 2011-03-01 ENCOUNTER — Ambulatory Visit (HOSPITAL_COMMUNITY)
Admission: RE | Admit: 2011-03-01 | Discharge: 2011-03-01 | Disposition: A | Discharge status: Home / Self Care | Payer: BC Managed Care – PPO | Source: Ambulatory Visit | Attending: Family Medicine | Admitting: Family Medicine

## 2011-03-01 DIAGNOSIS — R269 Unspecified abnormalities of gait and mobility: Secondary | ICD-10-CM

## 2011-03-01 DIAGNOSIS — M2560 Stiffness of unspecified joint, not elsewhere classified: Secondary | ICD-10-CM

## 2011-03-01 DIAGNOSIS — M6281 Muscle weakness (generalized): Secondary | ICD-10-CM

## 2011-03-01 DIAGNOSIS — M25569 Pain in unspecified knee: Secondary | ICD-10-CM

## 2011-03-01 NOTE — Progress Notes (Signed)
Physical Therapy Treatment Patient Details  Name: Kaitlin Meyers MRN: [id number] Date of Birth: [birth year]  Today's Date: 03/01/2011 Time: 2841-3244 Time Calculation (min): 47 min Visit#: 9  of 28   Re-eval: 03/10/11  Charge: therex 38 min  Subjective: Symptoms/Limitations Symptoms: Pt stated she feels good today, its late at night when the pain arrives.  Seen for R TKR, sciatica and low back pain today. Pain Assessment Currently in Pain?: No/denies  Objective:   Exercise/Treatments Stretches Prone on Elbows Stretch: Limitations Prone on Elbows Stretch Limitations: 2 minutes Stability Clam: Supine;15 reps;Limitations Clam Limitations: 4# each side Bridge: 15 reps Bent Knee Raise: 15 reps;Limitations Bent Knee Raise Limitations: with ab sets Ab Set: Limitations AB Set Limitations: done with all supine therex Straight Leg Raise: Supine;15 reps;Limitations Straight Leg Raises Limitations: 4# Hip Abduction: Side-lying;15 reps;Weights Hip Abduction Weights (lbs): 3 Opposite Arm/Leg Raise: Prone;5 reps;5 seconds Machine Exercises Cybex Leg Press: 2.5 Pl 2x 15 R leg only Stationary Bike: 8' @ 6.0  Aerobic Stationary Bike: 8' @ 6.0 Machines for Strengthening Cybex Leg Press: 2.5 Pl 2x 15 R leg only Supine Short Arc Quad Sets: Limitations;15 reps Short Arc Quad Sets Limitations: 4# 5 sec holds with ab sets Bridges: 15 reps Straight Leg Raises: 15 reps Straight Leg Raises Limitations: 4# with ab set    Physical Therapy Assessment and Plan PT Assessment and Plan Clinical Impression Statement: Todays session continued concentration on ab sets wtih all supine exercises for core strengthening  Added bent knee raises with ab set and prone arm/leg raise with vc-ing for stabilty, pt able to perform correctly following cues.   PT Plan: Continue with current POC, resume standing exercises next session.      Goals    Problem List Patient Active Problem List  Diagnoses  .  Abnormality of gait  . Pain in joint, lower leg  . Muscle weakness (generalized)  . Limitation of joint motion    PT - End of Session Activity Tolerance: Patient tolerated treatment well General Behavior During Session: [facility] for tasks performed Cognition: [facility] for tasks performed  Juel Burrow 03/01/2011, 4:47 PM

## 2011-03-04 ENCOUNTER — Ambulatory Visit (HOSPITAL_COMMUNITY)
Admission: RE | Admit: 2011-03-04 | Discharge: 2011-03-04 | Disposition: A | Discharge status: Home / Self Care | Payer: BC Managed Care – PPO | Source: Ambulatory Visit | Attending: Family Medicine | Admitting: Family Medicine

## 2011-03-04 NOTE — Progress Notes (Signed)
Physical Therapy Treatment Patient Details  Name: Kaitlin Meyers MRN: [id number] Date of Birth: Nov 01, 1955  Today's Date: 03/04/2011 Time: 1605-1700 Time Calculation (min): 55 min Visit#: 10  of 12   Re-eval: 03/07/11 Charges:  therex 45'    Subjective: Symptoms/Limitations Symptoms: Pt. reports she is exhausted from work today.  Pt. reports she has no pain today. Pain Assessment Currently in Pain?: No/denies   Exercise/Treatments Aerobic Stationary Bike: 8' @ 6.0 Machines for Strengthening Cybex Leg Press: 2.5 Pl 2x 15 R leg only Standing Lateral Step Up: 15 reps;Hand Hold: 1;Step Height: 6" Forward Step Up: 15 reps;Hand Hold: 0;Step Height: 6" Step Down: 15 reps;Hand Hold: 1;Step Height: 6" Rocker Board: 2 minutes Rocker Board Limitations: A/P, P/A, no hands Supine Short Arc Quad Sets: 15 reps;Limitations Short Arc Quad Sets Limitations: 4#, 5 sec hold Bridges: 15 reps Straight Leg Raises: 15 reps Straight Leg Raises Limitations: 4# with ab set Sidelying Hip ABduction: 15 reps Hip ABduction Limitations: 3# Prone  Opposite arm and leg 10 reps each    Physical Therapy Assessment and Plan PT Assessment and Plan Clinical Impression Statement: Pt. able to complete all therex with cues for core stability.  Pt. questioning if symptoms could be related to SI joint.  To have PT assess next visit. PT Plan: Re-assess next visit.     Problem List Patient Active Problem List  Diagnoses  . Abnormality of gait  . Pain in joint, lower leg  . Muscle weakness (generalized)  . Limitation of joint motion    PT - End of Session Activity Tolerance: Patient tolerated treatment well General Behavior During Session: [facility] for tasks performed Cognition: [facility] for tasks performed  Homer Miller B. Bascom Levels, PTA 03/04/2011, 5:07 PM

## 2011-03-06 ENCOUNTER — Ambulatory Visit (HOSPITAL_COMMUNITY): Payer: BC Managed Care – PPO

## 2011-03-07 ENCOUNTER — Ambulatory Visit (HOSPITAL_COMMUNITY)
Admission: RE | Admit: 2011-03-07 | Discharge: 2011-03-07 | Disposition: A | Discharge status: Home / Self Care | Payer: BC Managed Care – PPO | Source: Ambulatory Visit | Attending: Family Medicine | Admitting: Family Medicine

## 2011-03-07 DIAGNOSIS — M2560 Stiffness of unspecified joint, not elsewhere classified: Secondary | ICD-10-CM

## 2011-03-07 DIAGNOSIS — M6281 Muscle weakness (generalized): Secondary | ICD-10-CM

## 2011-03-07 DIAGNOSIS — M25569 Pain in unspecified knee: Secondary | ICD-10-CM

## 2011-03-07 DIAGNOSIS — R269 Unspecified abnormalities of gait and mobility: Secondary | ICD-10-CM

## 2011-03-07 NOTE — Progress Notes (Signed)
Physical Therapy Re-Evaluation  Patient Details  Name: Kaitlin Meyers MRN: [id number] Date of Birth: 12/14/1955 Today's Date: 03/07/2011 Time: 4098-1191 Time Calculation (min): 45 min Charges: 1 re-eval, 30 TE Visit#: 11  of 12   Re-eval: 03/25/11    Past Medical History:  Past Medical History  Diagnosis Date  . Hypothyroidism   . Complication of anesthesia ~1976    difficulty awaking  . Arthritis   . Hx of migraines 01/15/11    "I've had 2 migraines w/o pain in my life; vision issues"   Past Surgical History:  Past Surgical History  Procedure Date  . Knee arthroscopy 01/2010    right  . Cervical disc surgery 2007  . Temporomandibular joint surgery 1987  . Bladder surgery 2011    "mesh"  . 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 Vaginal hysterectomy 2011  . Partial knee arthroplasty 01/14/2011    Procedure: UNICOMPARTMENTAL KNEE;  Surgeon: Raymon Mutton, MD;  Location: [facility] OR;  Service: Orthopedics;  Laterality: Right;    Subjective Symptoms/Limitations Symptoms: Had a massage done to right hip which seemed to help the sciatica Pain Assessment Currently in Pain?: No/denies Pain Score: 0-No pain Pain Location: Knee Pain Orientation: Right Pain Type: Surgical pain  Assessment previously filed values = ( ) RLE AROM (degrees) Right Knee Extension 0-130: 0 (0) Right Knee Flexion 0-140: 130 (123) RLE Strength Right Hip Extension: 4+/5 (4/5) Right Knee Flexion: 5/5 Right Knee Extension: 4+/5 (4/5)  Exercise/Treatments Mobility/Balance  Static Standing Balance previously filed values= ( ) Single Leg Stance - Right Leg: 30 (21) Single Leg Stance - Left Leg: 30  (26)  Lumbar Exercises: Stability Bent Knee Raise: 15 reps;Limitations Bent Knee Raise Limitations: with ab sets Toe Tap: 10 reps;2 seconds Machine Exercises Cybex Knee Extension: 3 PL bil x 10 Cybex Knee Flexion: 3.5 PL 2 x 10 Cybex Leg Press: 2.5 Pl 2x  15 R leg only Stationary Bike: 8' @ 6.0  Machines for Strengthening Cybex Knee Extension: 3 PL bil x 10 Cybex Knee Flexion: 3.5 PL 2 x 10 Cybex Leg Press: 2.5 Pl 2x 15 R leg only Standing Lateral Step Up: 15 reps;Hand Hold: 1;Step Height: 6" Forward Step Up: 15 reps;Hand Hold: 0;Step Height: 6" Step Down: 15 reps;Hand Hold: 1;Step Height: 6" Supine Straight Leg Raises: 15 reps Straight Leg Raises Limitations: 4# with ab set Straight Leg Raise with External Rotation: 15 reps Straight Leg Raise with External Rotation Limitations: 3# Sidelying Hip ABduction: 15 reps Hip ABduction Limitations: 4# Hip ADduction: 15 reps Clams: 4# Prone  Hip Extension: 15 reps Hip Extension Limitations: alternating legs for low back work.   Other Prone Exercises: prone quad stretch PROM 3x 30 sec  Physical Therapy Assessment and Plan Clinical Impression Statement: SI joints checked, pelvis level bil and SI joints rotate appropriately with stepping test.  No pelvic rotations or upslips noted.  Re-eval performed on the knee.  Patient is progressing very well with knee ROM.  Continues to need hip strengthening, and high level leg strengthening right side.  The patient has met 2/3 STGs with 2 new STGs added.  The patient has met 2/5 LTGs with one LTG added today.   Rehab Potential: Good PT Frequency: Min 2X/week PT Duration: Other (comment) (2 weeks) PT Treatment/Interventions: Stair training;Functional mobility training;Therapeutic activities;Therapeutic exercise;Balance training;Neuromuscular re-education;Patient/family education;Other (comment) (STM and modalities as needed for pain) PT Plan: continue to work 2 xs/wk x 2.5 weeks to  increase core strength and perform higher level knee strengthening exercises with goal for R leg strength WFL, improve core strength and be able to go up and down the stairs confidently without  handrails.      Goals Home Exercise Program Pt will Perform Home Exercise Program:  Independently PT Goal: Perform Home Exercise Program - Progress: Met PT Short Term Goals Time to Complete Short Term Goals: 2 weeks PT Short Term Goal 1: Patient's daily pain 0/10 right knee with max weekly pain 3/10 R knee for improved QOL  PT Short Term Goal 1 - Progress: Met (for the knee, 7/10 for right hip) PT Short Term Goal 2: Patient will increase knee strength to 5/5 to show improved strength and symmetry between her legs PT Short Term Goal 2 - Progress: Partly met PT Short Term Goal 3: Patient will be able to ambulate indoor household distances without an assistive device and greater than 4.4 ft/sec gait speed to show safety with gait without a device PT Short Term Goal 3 - Progress: Progressing toward goal PT Short Term Goal 4: The patient will report no right hip pain at night for the past week.   (goal set 03/07/11) PT Short Term Goal 5: Core muscle strength (abs and back extensors) will be at least 4/5 to show improved strength and spinal stability.   (goal set 03/07/11) PT Long Term Goals Time to Complete Long Term Goals: Other (comment) (2 weeks) PT Long Term Goal 1: Patient will report 0/10 pain level R knee for improved QOL  PT Long Term Goal 1 - Progress: Met PT Long Term Goal 2: Patient will improve her R knee ROM to at least -2-128 degrees of flexion to make her knee more symmetrical with her other knee PT Long Term Goal 2 - Progress: Met Long Term Goal 3: The pateint will be able to go up and down a fligt of stairs reciprocally without handrails to show improved strength mobility and balance Long Term Goal 3 Progress: Partly met (can, but not confidently) Long Term Goal 4: The patient will be able to pick up a box from the floor with correct form to simulate getting a pot out of the bottom cabinent without difficutly or pain Long Term Goal 4 Progress: Not met (not tested) PT Long Term Goal 5: The patient will be ablet to get on and off of the floor without assistance to show  improved strength and functional mobility.  Long Term Goal 5 Progress: Not met (not tested) Additional PT Long Term Goals?: Yes PT Long Term Goal 6: The patient will report a long term fitness plan and report that she re-joined the gym she quit before the knee surgery.   Long Term Goal 6 Progress: Other (comment) (goal set 03/07/11)  Problem List Patient Active Problem List  Diagnoses  . Abnormality of gait  . Pain in joint, lower leg  . Muscle weakness (generalized)  . Limitation of joint motion    PT - End of Session Activity Tolerance: Patient tolerated treatment well General Behavior During Session: [facility] for tasks performed Cognition: [facility] for tasks performed PT Plan of Care PT Home Exercise Plan: no new.   Consulted and Agree with Plan of Care: Patient  [name]. Topher Buenaventura, PT, DPT  03/07/2011, 6:16 PM  Physician Documentation Your signature is required to indicate approval of the treatment plan as stated above.  Please sign and either send electronically or make a copy of this report for your files and  return this physician signed original.   Please mark one 1.__approve of plan  2. ___approve of plan with the following conditions.   ______________________________                                                          _____________________ Physician Signature                                                                                                             Date

## 2011-03-08 ENCOUNTER — Ambulatory Visit (HOSPITAL_COMMUNITY): Payer: BC Managed Care – PPO

## 2011-03-08 MED ORDER — HYDROCODONE-ACETAMINOPHEN 5-325 MG PO TABS
ORAL_TABLET | ORAL | Status: AC
  Filled 2011-03-08: qty 1

## 2011-03-11 ENCOUNTER — Ambulatory Visit (HOSPITAL_COMMUNITY): Payer: BC Managed Care – PPO | Admitting: Physical Therapy

## 2011-03-13 ENCOUNTER — Ambulatory Visit (HOSPITAL_COMMUNITY)
Admission: RE | Admit: 2011-03-13 | Discharge: 2011-03-13 | Disposition: A | Discharge status: Home / Self Care | Payer: BC Managed Care – PPO | Source: Ambulatory Visit | Attending: Orthopedic Surgery | Admitting: Orthopedic Surgery

## 2011-03-13 ENCOUNTER — Ambulatory Visit (HOSPITAL_COMMUNITY): Payer: BC Managed Care – PPO

## 2011-03-13 DIAGNOSIS — M6281 Muscle weakness (generalized): Secondary | ICD-10-CM

## 2011-03-13 DIAGNOSIS — M2560 Stiffness of unspecified joint, not elsewhere classified: Secondary | ICD-10-CM

## 2011-03-13 DIAGNOSIS — R269 Unspecified abnormalities of gait and mobility: Secondary | ICD-10-CM

## 2011-03-13 DIAGNOSIS — M25569 Pain in unspecified knee: Secondary | ICD-10-CM

## 2011-03-13 NOTE — Progress Notes (Signed)
Physical Therapy Treatment Patient Details  Name: MARRI MCNEFF MRN: [id number] Date of Birth: 10-22-1955  Today's Date: 03/13/2011 Time: 5284-1324 Time Calculation (min): 41 min Charges: 41 TE Visit#: 12  of 15   Re-eval: 03/25/11    Subjective: Symptoms/Limitations Symptoms: No pain today  still some hip pain at night.   Pain Assessment Currently in Pain?: No/denies Pain Score: 0-No pain Pain Location: Knee Pain Orientation: Right Multiple Pain Sites: No  Exercise/Treatments Stretches Piriformis Stretch: 2 reps;30 seconds Lumbar Exercises: Stability Bridge: 15 reps Bent Knee Raise: 15 reps;Limitations Bent Knee Raise Limitations: with ab sets Toe Tap: 15 reps Opposite Arm/Leg Raise: Prone;15 reps Machine Exercises Cybex Knee Extension: 3.5 PL bil x 10 Cybex Knee Flexion: 4.0 PL 2 x 10 Cybex Leg Press: 2.5 Pl 2x 10 R leg only Stationary Bike: 8' @ 6.0  Stretches Piriformis Stretch: 2 reps;30 seconds Aerobic Stationary Bike: 8' @ 6.0 Machines for Strengthening Cybex Knee Extension: 3.5 PL bil x 10 Cybex Knee Flexion: 4.0 PL 2 x 10 Cybex Leg Press: 2.5 Pl 2x 10 R leg only Standing Lateral Step Up: 15 reps;Hand Hold: 1;Step Height: 6" Forward Step Up: 15 reps;Hand Hold: 0;Step Height: 6" Step Down: 15 reps;Hand Hold: 1;Step Height: 6" Functional Squat: 10 reps;Limitations Functional Squat Limitations: with ab set pick up blue ball off of 6" step Rocker Board: 2 minutes Rocker Board Limitations: A/P, P/A, no hands Supine Straight Leg Raises: 15 reps Straight Leg Raises Limitations: 4# with ab set Sidelying Hip ABduction: 20 reps Hip ABduction Limitations: 4# Hip ADduction: 15 reps Clams: 4# Prone  Other Prone Exercises: prone quad stretch PROM 3x 30 sec  Physical Therapy Assessment and Plan Clinical Impression Statement: Patient tolerated harder exercises well.  Continue with current POC, practice stiarwell in hospital reciprocally no rails.   PT  Plan: Continue for 3 more visits and then d/c. Continue with current POC, practice stiarwell in hospital reciprocally no rails.        Problem List Patient Active Problem List  Diagnoses  . Abnormality of gait  . Pain in joint, lower leg  . Muscle weakness (generalized)  . Limitation of joint motion   PT - End of Session Activity Tolerance: Patient tolerated treatment well General Behavior During Session: [facility] for tasks performed Cognition: [facility] for tasks performed PT Plan of Care PT Home Exercise Plan: no new.    [name] Meric Joye, PT, DPT  03/13/2011, 6:32 PM

## 2011-03-15 ENCOUNTER — Ambulatory Visit (HOSPITAL_COMMUNITY): Payer: BC Managed Care – PPO

## 2011-03-19 ENCOUNTER — Ambulatory Visit (HOSPITAL_COMMUNITY)
Admission: RE | Admit: 2011-03-19 | Discharge: 2011-03-19 | Disposition: A | Discharge status: Home / Self Care | Payer: BC Managed Care – PPO | Source: Ambulatory Visit | Attending: Family Medicine | Admitting: Family Medicine

## 2011-03-19 DIAGNOSIS — IMO0001 Reserved for inherently not codable concepts without codable children: Secondary | ICD-10-CM | POA: Insufficient documentation

## 2011-03-19 DIAGNOSIS — R262 Difficulty in walking, not elsewhere classified: Secondary | ICD-10-CM | POA: Insufficient documentation

## 2011-03-19 DIAGNOSIS — R269 Unspecified abnormalities of gait and mobility: Secondary | ICD-10-CM

## 2011-03-19 DIAGNOSIS — M25569 Pain in unspecified knee: Secondary | ICD-10-CM | POA: Insufficient documentation

## 2011-03-19 DIAGNOSIS — M2560 Stiffness of unspecified joint, not elsewhere classified: Secondary | ICD-10-CM

## 2011-03-19 DIAGNOSIS — M25669 Stiffness of unspecified knee, not elsewhere classified: Secondary | ICD-10-CM | POA: Insufficient documentation

## 2011-03-19 DIAGNOSIS — M6281 Muscle weakness (generalized): Secondary | ICD-10-CM

## 2011-03-19 NOTE — Progress Notes (Signed)
Physical Therapy Treatment Patient Details  Name: Kaitlin Meyers MRN: [id number] Date of Birth: Feb 07, 1956  Today's Date: 03/19/2011 Time: 5621-3086 Time Calculation (min): 51 min Charges: 51 TE Visit#: 13  of 15   Re-eval: 03/25/11    Subjective: Symptoms/Limitations Symptoms: Aching in posterior right knee Pain Assessment Currently in Pain?: Yes Pain Score:   2 Pain Location: Knee Pain Orientation: Right;Posterior Pain Type: Surgical pain  Exercise/Treatments Stretches Passive Hamstring Stretch: 2 reps;30 seconds;Limitations Passive Hamstring Stretch Limitations: with rope Lower Trunk Rotation: 5 reps;10 seconds Piriformis Stretch: 2 reps;30 seconds Lumbar Exercises: Stability Bent Knee Raise: 10 reps Bent Knee Raise Limitations: with ab sets Toe Tap: 15 reps;10 reps Opposite Arm/Leg Raise: Prone;10 reps Machine Exercises Cybex Knee Extension: 3.5 PL bil x 10, 2 PL R only 1x 10 Cybex Knee Flexion: 4.0 PL 1 x 10 bil, 2 PL 1 x 10 R only Cybex Leg Press: 2.5 Pl 2x 10 R leg only Stationary Bike: 8' @ 6.0 Stretches Passive Hamstring Stretch: 2 reps;30 seconds;Limitations Passive Hamstring Stretch Limitations: with rope Piriformis Stretch: 2 reps;30 seconds Aerobic Stationary Bike: 8' @ 6.0 Machines for Strengthening Cybex Knee Extension: 3.5 PL bil x 10, 2 PL R only 1x 10 Cybex Knee Flexion: 4.0 PL 1 x 10 bil, 2 PL 1 x 10 R only Cybex Leg Press: 2.5 Pl 2x 10 R leg only Standing Lateral Step Up: Hand Hold: 1;Step Height: 6";20 reps Forward Step Up: Hand Hold: 0;Step Height: 6";20 reps Step Down: Hand Hold: 1;Step Height: 6";20 reps Functional Squat: Limitations;20 reps Functional Squat Limitations: with ab set pick up blue ball off of 6" step Stairs: 2 RT hospital stairwell no hands.   Rocker Board: 2 minutes Rocker Board Limitations: A/P, P/A, no hands Supine Straight Leg Raises: 2 sets;10 reps Straight Leg Raises Limitations: 4# with ab set Sidelying Hip  ABduction: 2 sets;10 reps Hip ABduction Limitations: 4# Hip ADduction: 2 sets;10 reps Clams: 4#  Physical Therapy Assessment and Plan Clinical Impression Statement: The patient tolerated ther ex well, hamstring stretch with rope was a little tender.   PT Plan: Continue with current POC, d/c in two more sessions, work on lifting education with box next session (10#), progress to alternating arms legs in quadruped, try elliptical next session for warm up    Problem List Patient Active Problem List  Diagnoses  . Abnormality of gait  . Pain in joint, lower leg  . Muscle weakness (generalized)  . Limitation of joint motion   PT - End of Session Activity Tolerance: Patient tolerated treatment well General Behavior During Session: [facility] for tasks performed Cognition: [facility] for tasks performed PT Plan of Care PT Home Exercise Plan: no new Consulted and Agree with Plan of Care: Patient  [name]. Harvir Patry, PT, DPT  03/19/2011, 6:46 PM

## 2011-03-21 ENCOUNTER — Inpatient Hospital Stay (HOSPITAL_COMMUNITY): Admission: RE | Admit: 2011-03-21 | Discharge: 2011-03-21 | Payer: BC Managed Care – PPO | Source: Ambulatory Visit

## 2011-03-21 DIAGNOSIS — M6281 Muscle weakness (generalized): Secondary | ICD-10-CM

## 2011-03-21 DIAGNOSIS — M2560 Stiffness of unspecified joint, not elsewhere classified: Secondary | ICD-10-CM

## 2011-03-21 DIAGNOSIS — R269 Unspecified abnormalities of gait and mobility: Secondary | ICD-10-CM

## 2011-03-21 DIAGNOSIS — M25569 Pain in unspecified knee: Secondary | ICD-10-CM

## 2011-03-21 NOTE — Progress Notes (Signed)
Physical Therapy Treatment Patient Details  Name: MACKINSEY PELLAND MRN: [id number] Date of Birth: Jun 26, 1955  Today's Date: 03/21/2011 Time: 4098-1191 Time Calculation (min): 45 min Charges: 45 TE Visit#: 14  of 15   Re-eval: 03/24/10  Subjective: Symptoms/Limitations Symptoms: Patient is feeling good today.   Pain Assessment Currently in Pain?: No/denies Pain Score: 0-No pain Multiple Pain Sites: No  Exercise/Treatments Stretches Passive Hamstring Stretch: 2 reps;30 seconds;Limitations Passive Hamstring Stretch Limitations: with rope Piriformis Stretch: 2 reps;30 seconds Aerobic Elliptical: 6' @ 1.0 Machines for Strengthening Cybex Knee Extension: 4.0 PL bil x 10, 2 PL R only 1x 10 Cybex Knee Flexion: 4.0 PL 1 x 10 bil, 2 PL 1 x 10 R only Cybex Leg Press: 2.5 Pl 2x 10 R leg only Standing Lateral Step Up: Hand Hold: 1;Step Height: 6";20 reps Forward Step Up: Hand Hold: 0;Step Height: 6";20 reps Step Down: Hand Hold: 1;Step Height: 6";20 reps Functional Squat: Limitations;20 reps Functional Squat Limitations: lifting education with box Rocker Board: 2 minutes Rocker Board Limitations: A/P, P/A, no hands  Physical Therapy Assessment and Plan Clinical Impression Statement: The patient is progressing well with ther ex and has no complaints of either right hip or right knee pain.  We completed lifting education and low back protection strategies as well.   PT Plan: Re-eval for d/c to HEP next session.  Increase elliptical time to 8 mins.      Problem List Patient Active Problem List  Diagnoses  . Abnormality of gait  . Pain in joint, lower leg  . Muscle weakness (generalized)  . Limitation of joint motion   PT - End of Session Activity Tolerance: Patient tolerated treatment well General Behavior During Session: [facility] for tasks performed Cognition: [facility] for tasks performed PT Plan of Care PT Home Exercise Plan: no new Consulted and Agree with Plan of Care:  Patient  [name]. Muaz Shorey, PT, DPT  03/21/2011, 6:35 PM

## 2011-03-25 ENCOUNTER — Ambulatory Visit (HOSPITAL_COMMUNITY): Payer: BC Managed Care – PPO

## 2011-03-26 ENCOUNTER — Ambulatory Visit (HOSPITAL_COMMUNITY)
Admission: RE | Admit: 2011-03-26 | Discharge: 2011-03-26 | Disposition: A | Discharge status: Home / Self Care | Payer: BC Managed Care – PPO | Source: Ambulatory Visit | Attending: Family Medicine | Admitting: Family Medicine

## 2011-03-26 DIAGNOSIS — M25569 Pain in unspecified knee: Secondary | ICD-10-CM

## 2011-03-26 DIAGNOSIS — R269 Unspecified abnormalities of gait and mobility: Secondary | ICD-10-CM

## 2011-03-26 DIAGNOSIS — M6281 Muscle weakness (generalized): Secondary | ICD-10-CM

## 2011-03-26 DIAGNOSIS — M2560 Stiffness of unspecified joint, not elsewhere classified: Secondary | ICD-10-CM

## 2011-03-26 NOTE — Evaluation (Signed)
Physical Therapy Re-Evaluation/Discharge  Patient Details  Name: Kaitlin Meyers MRN: [id number] Date of Birth: 21-Oct-1955  Today's Date: 03/26/2011 Time: 4098-1191 Time Calculation (min): 45 min Charges: 1 re-eval, 1 MMT, 15 TE Visit#: 15  of 15   Re-eval: 03/25/11    Past Medical History:  Past Medical History  Diagnosis Date  . Hypothyroidism   . Complication of anesthesia ~1976    difficulty awaking  . Arthritis   . Hx of migraines 01/15/11    "I've had 2 migraines w/o pain in my life; vision issues"   Past Surgical History:  Past Surgical History  Procedure Date  . Knee arthroscopy 01/2010    right  . Cervical disc surgery 2007  . Temporomandibular joint surgery 1987  . Bladder surgery 2011    "mesh"  . 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 Vaginal hysterectomy 2011  . Partial knee arthroplasty 01/14/2011    Procedure: UNICOMPARTMENTAL KNEE;  Surgeon: Raymon Mutton, MD;  Location: [facility] OR;  Service: Orthopedics;  Laterality: Right;    Subjective Symptoms/Limitations Symptoms: Sunday night had quite a bit of pain- patient thinks she [place] at the beach and wore a small heel (pain in right buttocks).  She stretched and it seems to have resolved.    How long can you sit comfortably?: ok How long can you stand comfortably?: standing How long can you walk comfortably?: walking, unlimitied.   Repetition: Decreases Symptoms Pain Assessment Currently in Pain?: No/denies Pain Score: 0-No pain Pain Location: Knee Pain Orientation: Right Multiple Pain Sites: No  Assessment RLE Strength Right Hip Extension: 5/5 Right Knee Flexion: 5/5 Right Knee Extension: 5/5 Lumbar AROM Lumbar Flexion: WFL Lumbar Extension: WFL Lumbar - Right Side Bend: WFL Lumbar - Left Side Bend: WFL Lumbar - Right Rotation: WFL Lumbar - Left Rotation: WFL Lumbar Strength Lumbar Flexion: 4+/5 Lumbar Extension:  3+/5  Exercise/Treatments Mobility/Balance  Static Standing Balance Single Leg Stance - Right Leg: 60  Single Leg Stance - Left Leg: 60   Stretches Piriformis Stretch: 2 reps;30 seconds Lumbar Exercises: Stability Bridge: 20 reps Machine Exercises Cybex Knee Extension: 4.0 PL bil x 10, 2 PL R only 1x 10 Cybex Knee Flexion: 4.5 PL 1 x 10 bil, 2 PL 1 x 10 R only Cybex Leg Press: 3 PL 2 x 10 Elliptical: 6' @ 3.0  Stretches Piriformis Stretch: 2 reps;30 seconds Aerobic Elliptical: 6' @ 3.0 Machines for Strengthening Cybex Knee Extension: 4.0 PL bil x 10, 2 PL R only 1x 10 Cybex Knee Flexion: 4.5 PL 1 x 10 bil, 2 PL 1 x 10 R only Cybex Leg Press: 3 PL 2 x 10 Standing Lateral Step Up: Step Height: 8";10 reps;Hand Hold: 0 Forward Step Up: Hand Hold: 0;Step Height: 8";10 reps Step Down: Hand Hold: 1;Step Height: 8";10 reps Lunge Walking - Round Trips: 1 RT  Physical Therapy Assessment and Plan Clinical Impression Statement: Re-assessment performed.  The patient has 5/5 leg strength bil and much improved core strength.  Her knee pain has resolved and her sciatic pain she is managing with HEP and stretches successfully at home.  She has met 4/6 STGs and 6/6 LTGs.  The patient is satisfied with her outcome with PT and is ready for d/c.   PT Plan: d/c home to HEP    Goals Home Exercise Program Pt will Perform Home Exercise Program: Independently PT Goal: Perform Home Exercise Program - Progress: Met PT Short Term  Goals PT Short Term Goal 1: Patient's daily pain 0/10 right knee with max weekly pain 3/10 R knee for improved QOL  PT Short Term Goal 1 - Progress: Met PT Short Term Goal 2: Patient will increase knee strength to 5/5 to show improved strength and symmetry between her legs  PT Short Term Goal 2 - Progress: Met PT Short Term Goal 3: Patient will be able to ambulate indoor household distances without an assistive device and greater than 4.4 ft/sec gait speed to show safety  with gait without a device  PT Short Term Goal 3 - Progress: Met PT Short Term Goal 4: The patient will report no right hip pain at night for the past week PT Short Term Goal 4 - Progress: Partly met (reported hip pain on sunday) PT Short Term Goal 5: Core muscle strength (abs and back extensors) will be at least 4/5 to show improved strength and spinal stability PT Short Term Goal 5 - Progress: Partly met PT Long Term Goals PT Long Term Goal 1: Patient will report 0/10 pain level R knee for improved QOL  PT Long Term Goal 1 - Progress: Met PT Long Term Goal 2: Patient will improve her R knee ROM to at least -2-128 degrees of flexion to make her knee more symmetrical with her other knee  PT Long Term Goal 2 - Progress: Met Long Term Goal 3: The pateint will be able to go up and down a fligt of stairs reciprocally without handrails to show improved strength mobility and balance  Long Term Goal 3 Progress: Met Long Term Goal 4: The patient will be able to pick up a box from the floor with correct form to simulate getting a pot out of the bottom cabinent without difficutly or pain  Long Term Goal 4 Progress: Met PT Long Term Goal 5: The patient will be ablet to get on and off of the floor without assistance to show improved strength and functional mobility Long Term Goal 5 Progress: Met PT Long Term Goal 6: The patient will report a long term fitness plan and report that she re-joined the gym she quit before the knee surgery Long Term Goal 6 Progress: Met  Problem List Patient Active Problem List  Diagnoses  . Abnormality of gait  . Pain in joint, lower leg  . Muscle weakness (generalized)  . Limitation of joint motion    PT - End of Session Activity Tolerance: Patient tolerated treatment well General Behavior During Session: [name] for tasks performed Cognition: [name] for tasks performed PT Plan of Care PT Home Exercise Plan: no new Consulted and Agree with Plan of Care:  Patient  [name], PT, DPT  03/26/2011, 7:31 PM  Physician Documentation Your signature is required to indicate approval of the treatment plan as stated above.  Please sign and either send electronically or make a copy of this report for your files and return this physician signed original.   Please mark one 1.__approve of plan  2. ___approve of plan with the following conditions.   ______________________________                                                          _____________________ Physician Signature  Date  

## 2012-01-22 ENCOUNTER — Ambulatory Visit (INDEPENDENT_AMBULATORY_CARE_PROVIDER_SITE_OTHER): Payer: BC Managed Care – PPO | Admitting: Orthopedic Surgery

## 2012-01-22 ENCOUNTER — Encounter: Payer: Self-pay | Admitting: Orthopedic Surgery

## 2012-01-22 VITALS — BP 120/80 | Ht 64.0 in | Wt 187.0 lb

## 2012-01-22 DIAGNOSIS — M79643 Pain in unspecified hand: Secondary | ICD-10-CM

## 2012-01-22 DIAGNOSIS — M79609 Pain in unspecified limb: Secondary | ICD-10-CM

## 2012-01-22 DIAGNOSIS — M6530 Trigger finger, unspecified finger: Secondary | ICD-10-CM

## 2012-01-22 NOTE — Patient Instructions (Addendum)
You have received a steroid shot. 15% of patients experience increased pain at the injection site with in the next 24 hours. This is best treated with ice and tylenol extra strength 2 tabs every 8 hours. If you are still having pain please call the office.   Trigger Finger Trigger finger (digital tendinitis and stenosing tenosynovitis) is a common disorder that causes an often painful catching of the fingers or thumb. It occurs as a clicking, snapping or locking of a finger in the palm of the hand. The reason for this is that there is a problem with the tendons which flex the fingers sliding smoothly through their sheaths. The cause of this may be inflammation of the tendon and sheath, or from a thickening or nodule in the tendon. The condition may occur in any finger or a couple fingers at the same time. The cause may be overuse while doing the same activity over and over again with your hands.   Tendons are the tough cords that connect the muscles to bones. Muscles and tendons are part of the system which allows your body to move. When muscles contract in the forearm on the palm side, they pull the tendons toward the elbow and cause the fingers and thumb to bend (flex) toward the palm. These are the flexor tendons. The tendons slide through a slippery smooth membrane (synovium) which is called the tendon sheath. The sheaths have areas of tough fibrous tissues surrounding them which hold the tendons close to the bone. These are called pulleys because they work like a pulley. The first pulley is in the palm of the hand near the crease which runs across your palm. If the area of the tendon thickening is near the pulley, the tendon cannot slide smoothly through the pulley and this causes the trigger finger. The finger may lock with the finger curled or suddenly straighten out with a snap. This is more common in patients with rheumatoid arthritis and diabetes. Left untreated, the condition may get worse to the  point where the finger becomes locked in flexion, like making a fist, or less commonly locked with the finger straightened out. DIAGNOSIS   Your caregiver will easily make this diagnosis on examination. TREATMENT    Splinting for 6 to 8 weeks of time may be helpful. Use the splints as your caregiver suggests.   Heat used for twenty minutes at least four times a day followed by ice packs for twenty minutes unless directed otherwise by your caregiver may be helpful. If you find either heat or cold seems to be making the problem worse, quit using them and ask your caregiver for directions.   Cortisone injections along with splinting may speed up recovery. Several injections may be required. Cortisone may give relief after one injection.   Only take over-the-counter or prescription medicines for pain, discomfort, or fever as directed by your caregiver.   Surgery is another treatment that may be used if conservative treatments using injection and splinting does not work. Surgery can be minor without incisions (a cut does not have to be made) and can be done with a needle through the skin. No stitches are needed and most patients may return to work the same day.   Other surgical choices involve an open procedure where the surgeon opens the hand through a small incision (cut) and cuts the pulley so the tendon can again slide smoothly. Your hand will still work fine. This small operation requires stitches and the recovery will   be a little longer and the incisions will need to be protected until completely healed. You may have to limit your activities for up to 6 months.   Occupational or hand therapy may be required if there is stiffness remaining in the finger.  RISKS AND COMPLICATIONS Complications are uncommon but some problems that may occur are:  Recurrence of the trigger finger. This does not mean that the surgery was not well done. It simply means that you may have formed scar tissue following  surgery that causes the problem to reoccur.   Infection which could ruin the results of the surgery and can result in a finger which is frozen and can not move normally.   Nerve injury is possible which could result in permanent numbness of one or more fingers.  CARE AFTER SURGERY  Elevate your hand above your heart and use ice as instructed.   Follow instructions regarding finger motion/exercise.   Keep the surgical wound dry for at least 48 hrs or longer if instructed.   Keep your follow-up appointments.   Return to work and normal activities as instructed.  SEEK IMMEDIATE MEDICAL CARE IF:   Your problems are getting worse or you do not obtain relief from the treatment. Document Released: 11/18/2003 Document Revised: 04/22/2011 Document Reviewed: 07/12/2008 [employer] Patient Information 2013 [employer].    

## 2012-01-22 NOTE — Progress Notes (Signed)
Patient ID: Kaitlin Meyers, female   DOB: 1955-07-22, 56 y.o.   MRN: [id number] Chief Complaint  Patient presents with  . Hand Pain    Right finger and hand pain fell 3 months ago    56 years old fell about a month ago and after that started having problems with the right hand ring finger with catching locking difficulty trying to straighten the finger out and at times difficulty time trying to bend the finger. She had an x-ray at [facility] physicians on August 2 I have her report she did not bring the film there was no fracture or subluxation  She reports catching and swelling and pain over the A1 pulley  Review of systems positive for seasonal allergies otherwise normal Past Medical History  Diagnosis Date  . Hypothyroidism   . Complication of anesthesia ~1976    difficulty awaking  . Arthritis   . Hx of migraines 01/15/11    "I've had 2 migraines w/o pain in my life; vision issues"    Physical Exam(6) GENERAL: normal development   CDV: pulses are normal   Skin: normal  Psychiatric: awake, alert and oriented  Neuro: normal sensation  MSK Gait:  No abnormalities noted in the gait analysis 1 the ring finger on the right hand is tender and swollen over the A1 pulley there is decreased flexion in the finger is held in slight flexion. Stability is normal PIP and PIP joint flexion normal.   Impression tenosynovitis right ring finger  Plan injection  Procedure note trigger finger injection  Diagnosis trigger finger Postop diagnosis trigger finger Procedure injection of trigger finger RRF Finger injected RRF Details of procedure: After verbal consent and timeout to confirm site the RRF was injected with 1 cc of 40 mg of Depo-Medrol and 1 cc of 1% lidocaine this was then repeated on the LEFT finger  The procedure was tolerated well without complication

## 2012-02-19 ENCOUNTER — Ambulatory Visit: Payer: BC Managed Care – PPO | Admitting: Orthopedic Surgery

## 2012-03-10 ENCOUNTER — Encounter: Payer: Self-pay | Admitting: Orthopedic Surgery

## 2012-03-10 ENCOUNTER — Ambulatory Visit (INDEPENDENT_AMBULATORY_CARE_PROVIDER_SITE_OTHER): Payer: BC Managed Care – PPO | Admitting: Orthopedic Surgery

## 2012-03-10 VITALS — BP 110/62 | Ht 64.0 in | Wt 187.0 lb

## 2012-03-10 DIAGNOSIS — M6530 Trigger finger, unspecified finger: Secondary | ICD-10-CM

## 2012-03-10 NOTE — Patient Instructions (Signed)
activities as tolerated 

## 2012-03-10 NOTE — Progress Notes (Signed)
Patient ID: Kaitlin Meyers, female   DOB: 1955-04-13, 57 y.o.   MRN: [id number] Chief Complaint  Patient presents with  . Follow-up    right trigger ring finger s/p injection   RIGHT RING FINGER   NO TRIGGERING;   NORMAL ROM NO LOCKING   RRF TRIGGER NORMAL   PRN

## 2012-09-14 ENCOUNTER — Emergency Department (HOSPITAL_COMMUNITY)
Admission: EM | Admit: 2012-09-14 | Discharge: 2012-09-14 | Disposition: A | Discharge status: Home / Self Care | Payer: BC Managed Care – PPO | Attending: Emergency Medicine | Admitting: Emergency Medicine

## 2012-09-14 DIAGNOSIS — Z91040 Latex allergy status: Secondary | ICD-10-CM | POA: Insufficient documentation

## 2012-09-14 DIAGNOSIS — Z7982 Long term (current) use of aspirin: Secondary | ICD-10-CM | POA: Insufficient documentation

## 2012-09-14 DIAGNOSIS — R42 Dizziness and giddiness: Secondary | ICD-10-CM | POA: Insufficient documentation

## 2012-09-14 DIAGNOSIS — Z79899 Other long term (current) drug therapy: Secondary | ICD-10-CM | POA: Insufficient documentation

## 2012-09-14 DIAGNOSIS — Y929 Unspecified place or not applicable: Secondary | ICD-10-CM | POA: Insufficient documentation

## 2012-09-14 DIAGNOSIS — E039 Hypothyroidism, unspecified: Secondary | ICD-10-CM | POA: Insufficient documentation

## 2012-09-14 DIAGNOSIS — IMO0002 Reserved for concepts with insufficient information to code with codable children: Secondary | ICD-10-CM | POA: Insufficient documentation

## 2012-09-14 DIAGNOSIS — F411 Generalized anxiety disorder: Secondary | ICD-10-CM | POA: Insufficient documentation

## 2012-09-14 DIAGNOSIS — Y939 Activity, unspecified: Secondary | ICD-10-CM | POA: Insufficient documentation

## 2012-09-14 DIAGNOSIS — Z880 Allergy status to penicillin: Secondary | ICD-10-CM | POA: Insufficient documentation

## 2012-09-14 DIAGNOSIS — R079 Chest pain, unspecified: Secondary | ICD-10-CM | POA: Insufficient documentation

## 2012-09-14 DIAGNOSIS — T6391XA Toxic effect of contact with unspecified venomous animal, accidental (unintentional), initial encounter: Secondary | ICD-10-CM | POA: Insufficient documentation

## 2012-09-14 DIAGNOSIS — T63461A Toxic effect of venom of wasps, accidental (unintentional), initial encounter: Secondary | ICD-10-CM | POA: Insufficient documentation

## 2012-09-14 DIAGNOSIS — F410 Panic disorder [episodic paroxysmal anxiety] without agoraphobia: Secondary | ICD-10-CM | POA: Insufficient documentation

## 2012-09-14 DIAGNOSIS — M129 Arthropathy, unspecified: Secondary | ICD-10-CM | POA: Insufficient documentation

## 2012-09-14 DIAGNOSIS — Z7902 Long term (current) use of antithrombotics/antiplatelets: Secondary | ICD-10-CM | POA: Insufficient documentation

## 2012-09-14 DIAGNOSIS — T7840XA Allergy, unspecified, initial encounter: Secondary | ICD-10-CM

## 2012-09-14 DIAGNOSIS — Z8679 Personal history of other diseases of the circulatory system: Secondary | ICD-10-CM | POA: Insufficient documentation

## 2012-09-14 DIAGNOSIS — R0602 Shortness of breath: Secondary | ICD-10-CM | POA: Insufficient documentation

## 2012-09-14 MED ORDER — FAMOTIDINE 20 MG PO TABS
20.0000 mg | ORAL_TABLET | Freq: Once | ORAL | Status: AC
  Administered 2012-09-14: 20 mg via ORAL
  Filled 2012-09-14: qty 1

## 2012-09-14 MED ORDER — PREDNISONE 50 MG PO TABS: ORAL_TABLET | ORAL | Status: DC

## 2012-09-14 MED ORDER — DIPHENHYDRAMINE HCL 25 MG PO TABS: 25.0000 mg | ORAL_TABLET | Freq: Four times a day (QID) | ORAL | Status: DC

## 2012-09-14 MED ORDER — PREDNISONE 50 MG PO TABS
60.0000 mg | ORAL_TABLET | Freq: Once | ORAL | Status: AC
  Administered 2012-09-14: 60 mg via ORAL
  Filled 2012-09-14: qty 1

## 2012-09-14 MED ORDER — DIPHENHYDRAMINE HCL 25 MG PO CAPS
25.0000 mg | ORAL_CAPSULE | Freq: Once | ORAL | Status: AC
  Administered 2012-09-14: 25 mg via ORAL
  Filled 2012-09-14: qty 1

## 2012-09-14 MED ORDER — EPINEPHRINE 0.3 MG/0.3ML IJ SOAJ: 0.3000 mg | INTRAMUSCULAR | Status: DC | PRN

## 2012-09-14 NOTE — ED Notes (Signed)
States that she was stung by a bee about 20 min prior to arrival, stung on right arm with redness at site of sting.  States that she feels some shortness of breath and throat tightness since the sting.

## 2012-09-14 NOTE — ED Provider Notes (Signed)
CSN: [id number]     Arrival date & time 09/14/12  1021 History  This chart was scribed for Glynn Octave, MD by Bennett Scrape, ED Scribe. This patient was seen in room APA10/APA10 and the patient's care was started at 10:40 AM.   First MD Initiated Contact with Patient 09/14/12 1032     Chief Complaint  Patient presents with  . Allergic Reaction    The history is provided by the patient. No language interpreter was used.   HPI Comments: Kaitlin Meyers is a 57 y.o. female who presents to the Emergency Department complaining of an allergic reaction after being stung by a wasp on the right upper arm 30 minutes ago. She denies pain or itching to the sting area. She reports associated lightheadedness, mild CP described as heaviness and mild SOB that she attributes to a panic attack which she states she has a history of.  She reports taking her daily Claritin and benadryl with mild improvement. Pt denies having prior episodes of similar symptoms.  She denies cough, wheezing, throat swelling or pain and difficulty swallowing as associated symptoms. She has a h/o hypothyroidism treated with Levoxyl and is taking Wellbutrin.    PCP is Dr. Sigmund Hazel in [place]  Past Medical History  Diagnosis Date  . Hypothyroidism   . Complication of anesthesia ~1976    difficulty awaking  . Arthritis   . Hx of migraines 01/15/11    "I've had 2 migraines w/o pain in my life; vision issues"   Past Surgical History  Procedure Laterality Date  . Knee arthroscopy  01/2010    right  . Cervical disc surgery  2007  . Temporomandibular joint surgery  1987  . Bladder surgery  2011    "mesh"  . 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 Vaginal hysterectomy  2011  . Partial knee arthroplasty  01/14/2011    Procedure: UNICOMPARTMENTAL KNEE;  Surgeon: Raymon Mutton, MD;  Location: [facility] OR;  Service: Orthopedics;  Laterality: Right;   Family History  Problem  Relation Age of Onset  . Arthritis    . Diabetes     History  Substance Use Topics  . Smoking status: Never Smoker   . Smokeless tobacco: Never Used  . Alcohol Use: 0.0 oz/week    0 Glasses of wine per week   No OB history provided.  Review of Systems  A complete 10 system review of systems was obtained and all systems are negative except as noted in the HPI and PMH.   Allergies  Azithromycin; Latex; Penicillins; and Sulfa antibiotics  Home Medications   Current Outpatient Rx  Name  Route  Sig  Dispense  Refill  . aspirin 81 MG chewable tablet   Oral   Chew 81 mg by mouth daily.         Marland Kitchen buPROPion (WELLBUTRIN XL) 300 MG 24 hr tablet   Oral   Take 300 mg by mouth daily.         . cholecalciferol (VITAMIN D) 1000 UNITS tablet   Oral   Take 1,000 Units by mouth daily.           Marland Kitchen enoxaparin (LOVENOX) 40 MG/0.4ML SOLN   Subcutaneous   Inject 0.4 mLs (40 mg total) into the skin daily.   10 Syringe   0   . estradiol (VIVELLE-DOT) 0.025 MG/24HR   Transdermal   Place 1 patch onto the skin 2 (two) times  a week. Sunday and wednesday          . fluticasone (VERAMYST) 27.5 MCG/SPRAY nasal spray   Nasal   Place 1 spray into the nose daily as needed. For allergies          . levothyroxine (LEVOXYL) 100 MCG tablet   Oral   Take 100 mcg by mouth daily.           Marland Kitchen loratadine-pseudoephedrine (CLARITIN-D 24-HOUR) 10-240 MG per 24 hr tablet   Oral   Take 1 tablet by mouth daily.            Triage Vitals: BP 129/63  Pulse 98  Temp(Src) 98.5 F (36.9 C) (Oral)  Resp 22  Wt 168 lb (76.204 kg)  BMI 28.82 kg/m2  SpO2 100%  Physical Exam  Nursing note and vitals reviewed. Constitutional: She is oriented to person, place, and time. She appears well-developed and well-nourished. No distress.  HENT:  Head: Normocephalic and atraumatic.  No swelling to the posterior oropharynx, tongue or floor of mouth, no asymmetry of oropharynx, uvula is midline, handling  secretions  Eyes: Conjunctivae and EOM are normal.  Neck: Normal range of motion. Neck supple. No tracheal deviation present.  Cardiovascular: Normal rate, regular rhythm and normal heart sounds.   No murmur heard. Pulmonary/Chest: Effort normal and breath sounds normal. No respiratory distress. She has no wheezes. She has no rales.  Abdominal: Soft. Bowel sounds are normal. There is no tenderness.  Musculoskeletal: Normal range of motion. She exhibits no edema.  Neurological: She is alert and oriented to person, place, and time. No cranial nerve deficit.  Skin: Skin is warm and dry. No rash noted.  RUE has erythema and edema at site of sting  Psychiatric: Her behavior is normal. Her mood appears anxious.    ED Course   Procedures (including critical care time)  Medications  predniSONE (DELTASONE) tablet 60 mg (60 mg Oral Given 09/14/12 1057)  famotidine (PEPCID) tablet 20 mg (20 mg Oral Given 09/14/12 1057)  diphenhydrAMINE (BENADRYL) capsule 25 mg (25 mg Oral Given 09/14/12 1057)    DIAGNOSTIC STUDIES: Oxygen Saturation is 100% on room air, normal by my interpretation.    COORDINATION OF CARE: 10:42 AM-Discussed treatment plan which includes medications and EKG with pt at bedside and pt agreed to plan.  11:23 AM-Pt rechecked and feels improved with medications listed above. Will observe for another hour. 12:12 PM-Pt rechecked and reports that she is back to her baseline. Upon re-exam, the redness and swelling at the sting site have improved. No throat swelling. Lungs are clear to ausculation. Discussed discharge plan which includes epi pen as needed for severe reactions such as trouble breathing or throat swelling only with pt and pt agreed to plan. Advised pt that if the epi pen is used, she will need to seek treatment. Also advised pt to follow up as needed and pt agreed. Addressed symptoms to return for with pt.   Labs Reviewed - No data to display No results found. No diagnosis  found.  MDM  Bee sting 30 minutes prior to arrival on the right arm. Denies difficulty breathing, throat swelling, chest pain or wheezing. Controlled secretions. Speaking in full sentences  Patient observed in ED for one hour without deterioration symptoms. She feels back to baseline. No trouble breathing, no tongue or throat swelling. We'll discharge with antihistamines and steroids. EpiPen given with instructions to use for severe allergic reaction only. Return precautions discussed.   Date: 09/14/2012  Rate: 86  Rhythm: normal sinus rhythm  QRS Axis: normal  Intervals: normal  ST/T Wave abnormalities: normal  Conduction Disutrbances:none  Narrative Interpretation:   Old EKG Reviewed: none available   I personally performed the services described in this documentation, which was scribed in my presence. The recorded information has been reviewed and is accurate.    Glynn Octave, MD 09/14/12 1215

## 2012-09-14 NOTE — ED Notes (Signed)
Pt alert & oriented x4, stable gait. Patient given discharge instructions, paperwork & prescription(s). Patient  instructed to stop at the registration desk to finish any additional paperwork. Patient verbalized understanding. Pt left department w/ no further questions. 

## 2013-04-13 ENCOUNTER — Ambulatory Visit (INDEPENDENT_AMBULATORY_CARE_PROVIDER_SITE_OTHER): Payer: BC Managed Care – PPO | Admitting: Orthopedic Surgery

## 2013-04-13 VITALS — BP 134/85 | Ht 64.0 in | Wt 191.0 lb

## 2013-04-13 DIAGNOSIS — M6530 Trigger finger, unspecified finger: Secondary | ICD-10-CM

## 2013-04-13 NOTE — Progress Notes (Signed)
Patient ID: Kaitlin Meyers, female   DOB: Jul 02, 1955, 58 y.o.   MRN: [id number]  Chief Complaint  Patient presents with  . Follow-up    Left ring fing finger trigger, requesting injection.    Repeat injection left ring finger for trigger phenomena left ring finger   Procedure note trigger finger injection  Diagnosis trigger finger Postop diagnosis trigger finger Procedure injection of trigger finger  Details of procedure: After verbal consent and timeout to confirm site the left ring finger was injected with 1 cc of 40 mg of Depo-Medrol and 1 cc of 1% lidocaine  The procedure was tolerated well without complication

## 2013-04-13 NOTE — Patient Instructions (Signed)
You have received a steroid shot. 15% of patients experience increased pain at the injection site with in the next 24 hours. This is best treated with ice and tylenol extra strength 2 tabs every 8 hours. If you are still having pain please call the office.    

## 2014-05-09 ENCOUNTER — Other Ambulatory Visit: Payer: Self-pay | Admitting: Obstetrics and Gynecology

## 2014-05-10 LAB — CYTOLOGY - PAP

## 2014-10-03 ENCOUNTER — Encounter (HOSPITAL_COMMUNITY): Payer: Self-pay | Admitting: *Deleted

## 2014-10-03 ENCOUNTER — Emergency Department (HOSPITAL_COMMUNITY)
Admission: EM | Admit: 2014-10-03 | Discharge: 2014-10-03 | Disposition: A | Discharge status: Home / Self Care | Payer: BLUE CROSS/BLUE SHIELD | Attending: Emergency Medicine | Admitting: Emergency Medicine

## 2014-10-03 DIAGNOSIS — Z880 Allergy status to penicillin: Secondary | ICD-10-CM | POA: Diagnosis not present

## 2014-10-03 DIAGNOSIS — E039 Hypothyroidism, unspecified: Secondary | ICD-10-CM | POA: Diagnosis not present

## 2014-10-03 DIAGNOSIS — Z7982 Long term (current) use of aspirin: Secondary | ICD-10-CM | POA: Insufficient documentation

## 2014-10-03 DIAGNOSIS — Y92512 Supermarket, store or market as the place of occurrence of the external cause: Secondary | ICD-10-CM | POA: Diagnosis not present

## 2014-10-03 DIAGNOSIS — Z8679 Personal history of other diseases of the circulatory system: Secondary | ICD-10-CM | POA: Diagnosis not present

## 2014-10-03 DIAGNOSIS — R42 Dizziness and giddiness: Secondary | ICD-10-CM | POA: Insufficient documentation

## 2014-10-03 DIAGNOSIS — X30XXXA Exposure to excessive natural heat, initial encounter: Secondary | ICD-10-CM | POA: Diagnosis not present

## 2014-10-03 DIAGNOSIS — T675XXA Heat exhaustion, unspecified, initial encounter: Secondary | ICD-10-CM | POA: Diagnosis not present

## 2014-10-03 DIAGNOSIS — R531 Weakness: Secondary | ICD-10-CM | POA: Diagnosis present

## 2014-10-03 DIAGNOSIS — Y998 Other external cause status: Secondary | ICD-10-CM | POA: Diagnosis not present

## 2014-10-03 DIAGNOSIS — Z79899 Other long term (current) drug therapy: Secondary | ICD-10-CM | POA: Insufficient documentation

## 2014-10-03 DIAGNOSIS — M1990 Unspecified osteoarthritis, unspecified site: Secondary | ICD-10-CM | POA: Diagnosis not present

## 2014-10-03 DIAGNOSIS — Z91040 Latex allergy status: Secondary | ICD-10-CM | POA: Insufficient documentation

## 2014-10-03 DIAGNOSIS — Y9389 Activity, other specified: Secondary | ICD-10-CM | POA: Diagnosis not present

## 2014-10-03 LAB — URINALYSIS, ROUTINE W REFLEX MICROSCOPIC
BILIRUBIN URINE: NEGATIVE
Glucose, UA: NEGATIVE mg/dL
HGB URINE DIPSTICK: NEGATIVE
KETONES UR: NEGATIVE mg/dL
Nitrite: NEGATIVE
PH: 5.5 (ref 5.0–8.0)
Protein, ur: NEGATIVE mg/dL
SPECIFIC GRAVITY, URINE: 1.011 (ref 1.005–1.030)
Urobilinogen, UA: 0.2 mg/dL (ref 0.0–1.0)

## 2014-10-03 LAB — CBC
HCT: 44.8 % (ref 36.0–46.0)
HEMOGLOBIN: 15.2 g/dL — AB (ref 12.0–15.0)
MCH: 31.2 pg (ref 26.0–34.0)
MCHC: 33.9 g/dL (ref 30.0–36.0)
MCV: 92 fL (ref 78.0–100.0)
Platelets: 153 10*3/uL (ref 150–400)
RBC: 4.87 MIL/uL (ref 3.87–5.11)
RDW: 13.5 % (ref 11.5–15.5)
WBC: 6.9 10*3/uL (ref 4.0–10.5)

## 2014-10-03 LAB — BASIC METABOLIC PANEL
ANION GAP: 8 (ref 5–15)
BUN: 14 mg/dL (ref 6–20)
CALCIUM: 9.5 mg/dL (ref 8.9–10.3)
CHLORIDE: 106 mmol/L (ref 101–111)
CO2: 26 mmol/L (ref 22–32)
Creatinine, Ser: 0.85 mg/dL (ref 0.44–1.00)
GFR calc non Af Amer: >60 mL/min (ref >60)
Glucose, Bld: 95 mg/dL (ref 65–99)
Potassium: 3.6 mmol/L (ref 3.5–5.1)
Sodium: 140 mmol/L (ref 135–145)

## 2014-10-03 LAB — CBG MONITORING, ED: Glucose-Capillary: 82 mg/dL (ref 65–99)

## 2014-10-03 LAB — URINE MICROSCOPIC-ADD ON

## 2014-10-03 NOTE — Discharge Instructions (Signed)
Heat-Related Illness  Rest and stay in air conditioning for today. When exerting yourself drink at least six 8 ounce glasses of water or Gatorade each day and avoid any beverages with caffeine or alcohol. Return if you for worse for any reason or contact Dr. Hyacinth Meeker Heat-related illnesses occur when the body is unable to properly cool itself. The body normally cools itself by sweating. However, under some conditions sweating is not enough. In these cases, a person's body temperature rises rapidly. Very high body temperatures may damage the brain or other vital organs. Some examples of heat-related illnesses include:  Heat stroke. This occurs when the body is unable to regulate its temperature. The body's temperature rises rapidly, the sweating mechanism fails, and the body is unable to cool down. Body temperature may rise to 106 F (41 C) or higher within 10 to 15 minutes. Heat stroke can cause death or permanent disability if emergency treatment is not provided.  Heat exhaustion. This is a milder form of heat-related illness that can develop after several days of exposure to high temperatures and not enough fluids. It is the body's response to an excessive loss of the water and salt contained in sweat.  Heat cramps. These usually affect people who sweat a lot during heavy activity. This sweating drains the body's salt and moisture. The low salt level in the muscles causes painful cramps. Heat cramps may also be a symptom of heat exhaustion. Heat cramps usually occur in the abdomen, arms, or legs. Get medical attention for cramps if you have heart problems or are on a low-sodium diet. Those that are at greatest risk for heat-related illnesses include:   The elderly.  Infant and the very young.  People with mental illness and chronic diseases.  People who are overweight (obese).  Young and healthy people can even succumb to heat if they participate in strenuous physical activities during hot  weather. CAUSES  Several factors affect the body's ability to cool itself during extremely hot weather. When the humidity is high, sweat will not evaporate as quickly. This prevents the body from releasing heat quickly. Other factors that can affect the body's ability to cool down include:   Age.  Obesity.  Fever.  Dehydration.  Heart disease.  Mental illness.  Poor circulation.  Sunburn.  Prescription drug use.  Alcohol use. SYMPTOMS  Heat stroke: Warning signs of heat stroke vary, but may include:  An extremely high body temperature (above 103F orally).  A fast, strong pulse.  Dizziness.  Confusion.  Red, hot, and dry skin.  No sweating.  Throbbing headache.  Feeling sick to your stomach (nauseous).  Unconsciousness. Heat exhaustion: Warning signs of heat exhaustion include:  Heavy sweating.  Tiredness.  Headache.  Paleness.  Weakness.  Feeling sick to your stomach (nauseous) or vomiting.  Muscle cramps. Heat cramps  Muscle pains or spasms. TREATMENT  Heat stroke  Get into a cool environment. An indoor place that is air-conditioned may be best.  Take a cool shower or bath. Have someone around to make sure you are okay.  Take your temperature. Make sure it is going down. Heat exhaustion  Drink plenty of fluids. Do not drink liquids that contain caffeine, alcohol, or large amounts of sugar. These cause you to lose more body fluid. Also, avoid very cold drinks. They can cause stomach cramps.  Get into a cool environment. An indoor place that is air-conditioned may be best.  Take a cool shower or bath. Have someone around to  make sure you are okay.  Put on lightweight clothing. Heat cramps  Stop whatever activity you were doing. Do not attempt to do that activity for at least 3 hours after the cramps have gone away.  Get into a cool environment. An indoor place that is air-conditioned may be best. HOME CARE INSTRUCTIONS  To protect  your health when temperatures are extremely high, follow these tips:  During heavy exercise in a hot environment, drink two to four glasses (16-32 ounces) of cool fluids each hour. Do not wait until you are thirsty to drink. Warning: If your caregiver limits the amount of fluid you drink or has you on water pills, ask how much you should drink while the weather is hot.  Do not drink liquids that contain caffeine, alcohol, or large amounts of sugar. These cause you to lose more body fluid.  Avoid very cold drinks. They can cause stomach cramps.  Wear appropriate clothing. Choose lightweight, light-colored, loose-fitting clothing.  If you must be outdoors, try to limit your outdoor activity to morning and evening hours. Try to rest often in shady areas.  If you are not used to working or exercising in a hot environment, start slowly and pick up the pace gradually.  Stay cool in an air-conditioned place if possible. If your home does not have air conditioning, go to the shopping mall or [employer].  Taking a cool shower or bath may help you cool off. SEEK MEDICAL CARE IF:   You see any of the symptoms listed above. You may be dealing with a life-threatening emergency.  Symptoms worsen or last longer than 1 hour.  Heat cramps do not get better in 1 hour. MAKE SURE YOU:   Understand these instructions.  Will watch your condition.  Will get help right away if you are not doing well or get worse. Document Released: 11/07/2007 Document Revised: 04/22/2011 Document Reviewed: 11/07/2007 [facility] Patient Information 2015 [place], [place]. This information is not intended to replace advice given to you by your health care provider. Make sure you discuss any questions you have with your health care provider.

## 2014-10-03 NOTE — ED Notes (Signed)
Pt was outside a lot yesterday, felt like she overheated, husband put pt in cold shower, pt rested in bed the rest of the day. Today pt came to [facility] to do some shopping, starting feeling weak and lightheaded. Denies pain. Reports nausea, denies vomiting.

## 2014-10-03 NOTE — ED Provider Notes (Signed)
CSN: [id number]     Arrival date & time 10/03/14  1139 History   First MD Initiated Contact with Patient 10/03/14 1501     Chief Complaint  Patient presents with  . Weakness     (Consider location/radiation/quality/duration/timing/severity/associated sxs/prior Treatment) HPI She was doing heavy yard work and hot sun yesterday when she came generally weak, lightheaded symptoms accompanied by some nausea. She felt improved after resting yesterday. Today she was doing some shopping in a store when she became lightheaded again accompanied by "a weak feeling in her stomach" she denies pain anywhere she denied nausea today denies shortness of breath no headache and no abdominal pain she is presently hungry and thirsty. No treatment prior to coming here. No visual changes no focal numbness or weakness. No urinary symptoms. No fever. No chest pain No other associated symptoms. She feels much improved since being here, without treatment. Past Medical History  Diagnosis Date  . Hypothyroidism   . Complication of anesthesia ~1976    difficulty awaking  . Arthritis   . Hx of migraines 01/15/11    "I've had 2 migraines w/o pain in my life; vision issues"   Past Surgical History  Procedure Laterality Date  . Knee arthroscopy  01/2010    right  . Cervical disc surgery  2007  . Temporomandibular joint surgery  1987  . Bladder surgery  2011    "mesh"  . 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 Vaginal hysterectomy  2011  . Partial knee arthroplasty  01/14/2011    Procedure: UNICOMPARTMENTAL KNEE;  Surgeon: Raymon Mutton, MD;  Location: [facility] OR;  Service: Orthopedics;  Laterality: Right;   Family History  Problem Relation Age of Onset  . Arthritis    . Diabetes     Social History  Substance Use Topics  . Smoking status: Never Smoker   . Smokeless tobacco: Never Used  . Alcohol Use: 0.0 oz/week    0 Glasses of wine per week   OB History    No  data available     Review of Systems  HENT: Negative.   Respiratory: Negative.   Cardiovascular: Negative.   Gastrointestinal: Positive for nausea.  Musculoskeletal: Negative.   Skin: Negative.   Neurological: Positive for weakness and light-headedness.  Psychiatric/Behavioral: Negative.   All other systems reviewed and are negative.     Allergies  Azithromycin; Latex; Penicillins; and Sulfa antibiotics  Home Medications   Prior to Admission medications   Medication Sig Start Date End Date Taking? Authorizing Provider  aspirin EC 81 MG tablet Take 81 mg by mouth daily.   Yes Historical Provider, MD  cholecalciferol (VITAMIN D) 1000 UNITS tablet Take 1,000 Units by mouth daily.     Yes Historical Provider, MD  EPINEPHrine (EPIPEN) 0.3 mg/0.3 mL SOAJ injection Inject 0.3 mLs (0.3 mg total) into the muscle as needed (as needed for severe allergic reaction). 09/14/12  Yes Glynn Octave, MD  hydroxypropyl methylcellulose (ISOPTO TEARS) 2.5 % ophthalmic solution Place 1 drop into both eyes daily as needed (Dry Eyes).   Yes Historical Provider, MD  ibuprofen (ADVIL,MOTRIN) 200 MG tablet Take 600 mg by mouth every 6 (six) hours as needed for pain.   Yes Historical Provider, MD  levothyroxine (SYNTHROID, LEVOTHROID) 112 MCG tablet Take 112 mcg by mouth every morning. 07/12/14  Yes Historical Provider, MD  diphenhydrAMINE (BENADRYL) 25 MG tablet Take 1 tablet (25 mg total) by mouth every 6 (six)  hours. Patient not taking: Reported on 10/03/2014 09/14/12   Glynn Octave, MD  predniSONE (DELTASONE) 50 MG tablet 1 tablet PO daily Patient not taking: Reported on 10/03/2014 09/14/12   Glynn Octave, MD   BP 132/59 mmHg  Pulse 85  Temp(Src) 97.8 F (36.6 C) (Oral)  Resp 18  SpO2 100% Physical Exam  Constitutional: She is oriented to person, place, and time. She appears well-developed and well-nourished.  HENT:  Head: Normocephalic and atraumatic.  Eyes: Conjunctivae are normal. Pupils are  equal, round, and reactive to light.  Neck: Neck supple. No tracheal deviation present. No thyromegaly present.  Cardiovascular: Normal rate and regular rhythm.   No murmur heard. Pulmonary/Chest: Effort normal and breath sounds normal.  Abdominal: Soft. Bowel sounds are normal. She exhibits no distension. There is no tenderness.  Musculoskeletal: Normal range of motion. She exhibits no edema or tenderness.  Gait normal mildly lightheaded on standing  Neurological: She is alert and oriented to person, place, and time. She has normal reflexes. Coordination normal.  DTR symmetric bilaterally at knee jerk ankle jerk biceps toes downward going bilaterally  Skin: Skin is warm and dry. No rash noted.  Psychiatric: She has a normal mood and affect.  Nursing note and vitals reviewed.   ED Course  Procedures (including critical care time) Labs Review Labs Reviewed  CBC - Abnormal; Notable for the following:    Hemoglobin 15.2 (*)    All other components within normal limits  URINALYSIS, ROUTINE W REFLEX MICROSCOPIC (NOT AT [facility]) - Abnormal; Notable for the following:    APPearance CLOUDY (*)    Leukocytes, UA SMALL (*)    All other components within normal limits  BASIC METABOLIC PANEL  URINE MICROSCOPIC-ADD ON  CBG MONITORING, ED    Imaging Review No results found. I have personally reviewed and evaluated these images and lab results as part of my medical decision-making.   EKG Interpretation None       Results for orders placed or performed during the hospital encounter of 10/03/14  Basic metabolic panel  Result Value Ref Range   Sodium 140 135 - 145 mmol/L   Potassium 3.6 3.5 - 5.1 mmol/L   Chloride 106 101 - 111 mmol/L   CO2 26 22 - 32 mmol/L   Glucose, Bld 95 65 - 99 mg/dL   BUN 14 6 - 20 mg/dL   Creatinine, Ser 1.61 0.44 - 1.00 mg/dL   Calcium 9.5 8.9 - 09.6 mg/dL   GFR calc non Af Amer >60 >60 mL/min   GFR calc Af Amer >60 >60 mL/min   Anion gap 8 5 - 15  CBC   Result Value Ref Range   WBC 6.9 4.0 - 10.5 K/uL   RBC 4.87 3.87 - 5.11 MIL/uL   Hemoglobin 15.2 (H) 12.0 - 15.0 g/dL   HCT 04.5 40.9 - 81.1 %   MCV 92.0 78.0 - 100.0 fL   MCH 31.2 26.0 - 34.0 pg   MCHC 33.9 30.0 - 36.0 g/dL   RDW 91.4 78.2 - 95.6 %   Platelets 153 150 - 400 K/uL  Urinalysis, Routine w reflex microscopic (not at [facility])  Result Value Ref Range   Color, Urine YELLOW YELLOW   APPearance CLOUDY (A) CLEAR   Specific Gravity, Urine 1.011 1.005 - 1.030   pH 5.5 5.0 - 8.0   Glucose, UA NEGATIVE NEGATIVE mg/dL   Hgb urine dipstick NEGATIVE NEGATIVE   Bilirubin Urine NEGATIVE NEGATIVE   Ketones, ur NEGATIVE NEGATIVE  mg/dL   Protein, ur NEGATIVE NEGATIVE mg/dL   Urobilinogen, UA 0.2 0.0 - 1.0 mg/dL   Nitrite NEGATIVE NEGATIVE   Leukocytes, UA SMALL (A) NEGATIVE  Urine microscopic-add on  Result Value Ref Range   Squamous Epithelial / LPF RARE RARE   WBC, UA 3-6 <3 WBC/hpf   Bacteria, UA RARE RARE   Urine-Other MUCOUS PRESENT   CBG monitoring, ED  Result Value Ref Range   Glucose-Capillary 82 65 - 99 mg/dL   No results found.   MDM  Patient well describes heat exhaustion as of yesterday. Today she is mildly lightheaded with standing and feels thirsty. Symptoms consistent with mild dehydration. Plan encourage oral hydration. Rest in cool environment. Final diagnoses:  None   diagnosis heat exhaustion      Doug Sou, MD 10/03/14 1525

## 2015-01-08 ENCOUNTER — Emergency Department (HOSPITAL_COMMUNITY): Payer: BLUE CROSS/BLUE SHIELD

## 2015-01-08 ENCOUNTER — Emergency Department (HOSPITAL_COMMUNITY)
Admission: EM | Admit: 2015-01-08 | Discharge: 2015-01-08 | Disposition: A | Discharge status: Home / Self Care | Payer: BLUE CROSS/BLUE SHIELD | Attending: Emergency Medicine | Admitting: Emergency Medicine

## 2015-01-08 ENCOUNTER — Encounter (HOSPITAL_COMMUNITY): Payer: Self-pay | Admitting: *Deleted

## 2015-01-08 DIAGNOSIS — R0602 Shortness of breath: Secondary | ICD-10-CM | POA: Diagnosis present

## 2015-01-08 DIAGNOSIS — E039 Hypothyroidism, unspecified: Secondary | ICD-10-CM | POA: Diagnosis not present

## 2015-01-08 DIAGNOSIS — R0789 Other chest pain: Secondary | ICD-10-CM | POA: Diagnosis not present

## 2015-01-08 DIAGNOSIS — Z79899 Other long term (current) drug therapy: Secondary | ICD-10-CM | POA: Diagnosis not present

## 2015-01-08 DIAGNOSIS — Z91040 Latex allergy status: Secondary | ICD-10-CM | POA: Diagnosis not present

## 2015-01-08 DIAGNOSIS — Z880 Allergy status to penicillin: Secondary | ICD-10-CM | POA: Diagnosis not present

## 2015-01-08 DIAGNOSIS — M542 Cervicalgia: Secondary | ICD-10-CM | POA: Diagnosis not present

## 2015-01-08 DIAGNOSIS — M1990 Unspecified osteoarthritis, unspecified site: Secondary | ICD-10-CM | POA: Diagnosis not present

## 2015-01-08 DIAGNOSIS — R0600 Dyspnea, unspecified: Secondary | ICD-10-CM | POA: Diagnosis not present

## 2015-01-08 DIAGNOSIS — Z7982 Long term (current) use of aspirin: Secondary | ICD-10-CM | POA: Insufficient documentation

## 2015-01-08 DIAGNOSIS — Z8679 Personal history of other diseases of the circulatory system: Secondary | ICD-10-CM | POA: Diagnosis not present

## 2015-01-08 LAB — CBC WITH DIFFERENTIAL/PLATELET
BASOS ABS: 0 10*3/uL (ref 0.0–0.1)
BASOS PCT: 1 %
Eosinophils Absolute: 0.2 10*3/uL (ref 0.0–0.7)
Eosinophils Relative: 2 %
HEMATOCRIT: 43.4 % (ref 36.0–46.0)
HEMOGLOBIN: 14.8 g/dL (ref 12.0–15.0)
LYMPHS PCT: 35 %
Lymphs Abs: 2.4 10*3/uL (ref 0.7–4.0)
MCH: 31.2 pg (ref 26.0–34.0)
MCHC: 34.1 g/dL (ref 30.0–36.0)
MCV: 91.4 fL (ref 78.0–100.0)
Monocytes Absolute: 0.5 10*3/uL (ref 0.1–1.0)
Monocytes Relative: 7 %
NEUTROS ABS: 3.8 10*3/uL (ref 1.7–7.7)
NEUTROS PCT: 55 %
Platelets: 137 10*3/uL — ABNORMAL LOW (ref 150–400)
RBC: 4.75 MIL/uL (ref 3.87–5.11)
RDW: 13.3 % (ref 11.5–15.5)
WBC: 6.8 10*3/uL (ref 4.0–10.5)

## 2015-01-08 LAB — BASIC METABOLIC PANEL
ANION GAP: 6 (ref 5–15)
BUN: 20 mg/dL (ref 6–20)
CHLORIDE: 110 mmol/L (ref 101–111)
CO2: 24 mmol/L (ref 22–32)
Calcium: 9.2 mg/dL (ref 8.9–10.3)
Creatinine, Ser: 0.79 mg/dL (ref 0.44–1.00)
GFR calc Af Amer: >60 mL/min (ref >60)
Glucose, Bld: 114 mg/dL — ABNORMAL HIGH (ref 65–99)
POTASSIUM: 3.8 mmol/L (ref 3.5–5.1)
SODIUM: 140 mmol/L (ref 135–145)

## 2015-01-08 LAB — TROPONIN I

## 2015-01-08 LAB — D-DIMER, QUANTITATIVE: D-Dimer, Quant: 0.29 ug/mL-FEU (ref 0.00–0.50)

## 2015-01-08 MED ORDER — ASPIRIN 81 MG PO CHEW
324.0000 mg | CHEWABLE_TABLET | Freq: Once | ORAL | Status: AC
  Administered 2015-01-08: 324 mg via ORAL
  Filled 2015-01-08: qty 4

## 2015-01-08 MED ORDER — KETOROLAC TROMETHAMINE 30 MG/ML IJ SOLN
30.0000 mg | Freq: Once | INTRAMUSCULAR | Status: AC
  Administered 2015-01-08: 30 mg via INTRAVENOUS
  Filled 2015-01-08: qty 1

## 2015-01-08 MED ORDER — NAPROXEN 500 MG PO TABS: 500.0000 mg | ORAL_TABLET | Freq: Two times a day (BID) | ORAL | Status: DC

## 2015-01-08 NOTE — ED Notes (Signed)
Iv infiltrated when flushing. IV removed, warm compress applied.

## 2015-01-08 NOTE — ED Provider Notes (Signed)
CSN: [id number]     Arrival date & time 01/08/15  0424 History   First MD Initiated Contact with Patient 01/08/15 0441     Chief Complaint  Patient presents with  . Shortness of Breath     (Consider location/radiation/quality/duration/timing/severity/associated sxs/prior Treatment) The history is provided by the patient.   59 year old female states that she started having pain in her neck which is radiating around to both shoulders yesterday. Today, the pain seemed to radiate around to the chest. It is worse in the right side. Pain is worse when she takes a deep breath and she rates pain at 8/10. She denies actually feeling dyspneic, but states that it hurts to take a breath. The chest pain is described as sharp. It is not worse with movement. She has been doing more bending and lifting recently with taking care of grandchildren. She does have history of cervical disc his pain is different from anything she had had with her disc. She is not done any treatment at home. She has no cardiac risk factors. Specifically, she is a nonsmoker and has no history of hypertension or diabetes or hyperlipidemia.  Past Medical History  Diagnosis Date  . Hypothyroidism   . Complication of anesthesia ~1976    difficulty awaking  . Arthritis   . Hx of migraines 01/15/11    "I've had 2 migraines w/o pain in my life; vision issues"   Past Surgical History  Procedure Laterality Date  . Knee arthroscopy  01/2010    right  . Cervical disc surgery  2007  . Temporomandibular joint surgery  1987  . Bladder surgery  2011    "mesh"  . 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 Vaginal hysterectomy  2011  . Partial knee arthroplasty  01/14/2011    Procedure: UNICOMPARTMENTAL KNEE;  Surgeon: Raymon Mutton, MD;  Location: [facility] OR;  Service: Orthopedics;  Laterality: Right;   Family History  Problem Relation Age of Onset  . Arthritis    . Diabetes     Social History   Substance Use Topics  . Smoking status: Never Smoker   . Smokeless tobacco: Never Used  . Alcohol Use: 0.0 oz/week    0 Glasses of wine per week   OB History    No data available     Review of Systems  All other systems reviewed and are negative.     Allergies  Azithromycin; Latex; Penicillins; and Sulfa antibiotics  Home Medications   Prior to Admission medications   Medication Sig Start Date End Date Taking? Authorizing Provider  aspirin EC 81 MG tablet Take 81 mg by mouth daily.   Yes Historical Provider, MD  cholecalciferol (VITAMIN D) 1000 UNITS tablet Take 1,000 Units by mouth daily.     Yes Historical Provider, MD  EPINEPHrine (EPIPEN) 0.3 mg/0.3 mL SOAJ injection Inject 0.3 mLs (0.3 mg total) into the muscle as needed (as needed for severe allergic reaction). 09/14/12  Yes Glynn Octave, MD  hydroxypropyl methylcellulose (ISOPTO TEARS) 2.5 % ophthalmic solution Place 1 drop into both eyes daily as needed (Dry Eyes).   Yes Historical Provider, MD  ibuprofen (ADVIL,MOTRIN) 200 MG tablet Take 600 mg by mouth every 6 (six) hours as needed for pain.   Yes Historical Provider, MD  levothyroxine (SYNTHROID, LEVOTHROID) 112 MCG tablet Take 112 mcg by mouth every morning. 07/12/14  Yes Historical Provider, MD  diphenhydrAMINE (BENADRYL) 25 MG tablet Take 1 tablet (25  mg total) by mouth every 6 (six) hours. Patient not taking: Reported on 10/03/2014 09/14/12   Glynn OctaveStephen Rancour, MD  predniSONE (DELTASONE) 50 MG tablet 1 tablet PO daily Patient not taking: Reported on 10/03/2014 09/14/12   Glynn OctaveStephen Rancour, MD   BP 129/71 mmHg  Pulse 81  Temp(Src) 97.9 F (36.6 C) (Oral)  Resp 14  Ht 5\' 4"  (1.626 m)  Wt 195 lb (88.451 kg)  BMI 33.46 kg/m2  SpO2 100% Physical Exam  Nursing note and vitals reviewed.  59 year old female, resting comfortably and in no acute distress. Vital signs are normal. Oxygen saturation is 100%, which is normal. Head is normocephalic and atraumatic. PERRLA, EOMI.  Oropharynx is clear. Neck is nontender and supple without adenopathy or JVD. Back is nontender and there is no CVA tenderness. Lungs are clear without rales, wheezes, or rhonchi. Chest is nontender. Heart has regular rate and rhythm without murmur. Abdomen is soft, flat, nontender without masses or hepatosplenomegaly and peristalsis is normoactive. Extremities have no cyanosis or edema, full range of motion is present. Skin is warm and dry without rash. Neurologic: Mental status is normal, cranial nerves are intact, there are no motor or sensory deficits.  ED Course  Procedures (including critical care time) Labs Review Results for orders placed or performed during the hospital encounter of 01/08/15  Basic metabolic panel  Result Value Ref Range   Sodium 140 135 - 145 mmol/L   Potassium 3.8 3.5 - 5.1 mmol/L   Chloride 110 101 - 111 mmol/L   CO2 24 22 - 32 mmol/L   Glucose, Bld 114 (H) 65 - 99 mg/dL   BUN 20 6 - 20 mg/dL   Creatinine, Ser 8.290.79 0.44 - 1.00 mg/dL   Calcium 9.2 8.9 - 56.210.3 mg/dL   GFR calc non Af Amer >60 >60 mL/min   GFR calc Af Amer >60 >60 mL/min   Anion gap 6 5 - 15  Troponin I  Result Value Ref Range   Troponin I <0.03 <0.031 ng/mL  CBC with Differential  Result Value Ref Range   WBC 6.8 4.0 - 10.5 K/uL   RBC 4.75 3.87 - 5.11 MIL/uL   Hemoglobin 14.8 12.0 - 15.0 g/dL   HCT 13.043.4 86.536.0 - 78.446.0 %   MCV 91.4 78.0 - 100.0 fL   MCH 31.2 26.0 - 34.0 pg   MCHC 34.1 30.0 - 36.0 g/dL   RDW 69.613.3 29.511.5 - 28.415.5 %   Platelets 137 (L) 150 - 400 K/uL   Neutrophils Relative % 55 %   Neutro Abs 3.8 1.7 - 7.7 K/uL   Lymphocytes Relative 35 %   Lymphs Abs 2.4 0.7 - 4.0 K/uL   Monocytes Relative 7 %   Monocytes Absolute 0.5 0.1 - 1.0 K/uL   Eosinophils Relative 2 %   Eosinophils Absolute 0.2 0.0 - 0.7 K/uL   Basophils Relative 1 %   Basophils Absolute 0.0 0.0 - 0.1 K/uL  D-dimer, quantitative (not at [facility])  Result Value Ref Range   D-Dimer, Quant 0.29 0.00 - 0.50  ug/mL-FEU   Imaging Review Dg Chest 2 View  01/08/2015  CLINICAL DATA:  Acute onset of shortness of breath and sharp neck and shoulder pain. Initial encounter. EXAM: CHEST  2 VIEW COMPARISON:  Chest radiograph performed 01/10/2011 FINDINGS: The lungs are well-aerated and clear. There is no evidence of focal opacification, pleural effusion or pneumothorax. The heart is normal in size; the mediastinal contour is within normal limits. No acute osseous abnormalities  are seen. Cervical spinal fusion hardware is partially imaged. IMPRESSION: No acute cardiopulmonary process seen. Electronically Signed   By: Roanna Raider M.D.   On: 01/08/2015 05:37   I have personally reviewed and evaluated these images and lab results as part of my medical decision-making.   EKG Interpretation   Date/Time:  Sunday January 08 2015 04:38:54 EST Ventricular Rate:  82 PR Interval:  140 QRS Duration: 86 QT Interval:  393 QTC Calculation: 459 R Axis:   57 Text Interpretation:  Sinus rhythm Atrial premature complex Abnormal  R-wave progression, early transition Baseline wander in lead(s) V3 When  compared with ECG of 10/03/2014, No significant change was found Confirmed  by [facility]  MD, Ladarrion Telfair ([id number]) on 01/08/2015 4:42:50 AM      MDM   Final diagnoses:  Musculoskeletal chest pain    Chest pain at seems most likely musculoskeletal as it is related to patient doing more bending and lifting than normal. No risk factors and no red flags to suggest more serious disease. She's given a dose of ketorolac and screening labs obtained. She had good relief of pain with this and screening labs including d-dimer and troponin are normal. Chest x-ray and ECG are normal. Patient is advised of test results and is discharged with prescription for naproxen.    Dione Booze, MD 01/08/15 [phone]

## 2015-01-08 NOTE — Discharge Instructions (Signed)
Take acetaminophen as needed for pain. Use ice and/or heat as needed.   Chest Wall Pain Chest wall pain is pain in or around the bones and muscles of your chest. Sometimes, an injury causes this pain. Sometimes, the cause may not be known. This pain may take several weeks or longer to get better. HOME CARE INSTRUCTIONS  Pay attention to any changes in your symptoms. Take these actions to help with your pain:   Rest as told by your health care provider.   Avoid activities that cause pain. These include any activities that use your chest muscles or your abdominal and side muscles to lift heavy items.   If directed, apply ice to the painful area:  Put ice in a plastic bag.  Place a towel between your skin and the bag.  Leave the ice on for 20 minutes, 2-3 times per day.  Take over-the-counter and prescription medicines only as told by your health care provider.  Do not use tobacco products, including cigarettes, chewing tobacco, and e-cigarettes. If you need help quitting, ask your health care provider.  Keep all follow-up visits as told by your health care provider. This is important. SEEK MEDICAL CARE IF:  You have a fever.  Your chest pain becomes worse.  You have new symptoms. SEEK IMMEDIATE MEDICAL CARE IF:  You have nausea or vomiting.  You feel sweaty or light-headed.  You have a cough with phlegm (sputum) or you cough up blood.  You develop shortness of breath.   This information is not intended to replace advice given to you by your health care provider. Make sure you discuss any questions you have with your health care provider.   Document Released: 01/28/2005 Document Revised: 10/19/2014 Document Reviewed: 04/25/2014 Elsevier Interactive Patient Education 2016 [employer]  Naproxen and naproxen sodium oral immediate-release tablets What is this medicine? NAPROXEN (na PROX en) is a non-steroidal anti-inflammatory drug (NSAID). It is used to reduce  swelling and to treat pain. This medicine may be used for dental pain, headache, or painful monthly periods. It is also used for painful joint and muscular problems such as arthritis, tendinitis, bursitis, and gout. This medicine may be used for other purposes; ask your health care provider or pharmacist if you have questions. What should I tell my health care provider before I take this medicine? They need to know if you have any of these conditions: -asthma -cigarette smoker -drink more than 3 alcohol containing drinks a day -heart disease or circulation problems such as heart failure or leg edema (fluid retention) -high blood pressure -kidney disease -liver disease -stomach bleeding or ulcers -an unusual or allergic reaction to naproxen, aspirin, other NSAIDs, other medicines, foods, dyes, or preservatives -pregnant or trying to get pregnant -breast-feeding How should I use this medicine? Take this medicine by mouth with a glass of water. Follow the directions on the prescription label. Take it with food if your stomach gets upset. Try to not lie down for at least 10 minutes after you take it. Take your medicine at regular intervals. Do not take your medicine more often than directed. Long-term, continuous use may increase the risk of heart attack or stroke. A special MedGuide will be given to you by the pharmacist with each prescription and refill. Be sure to read this information carefully each time. Talk to your pediatrician regarding the use of this medicine in children. Special care may be needed. Overdosage: If you think you have taken too much of this medicine  contact a poison control center or emergency room at once. NOTE: This medicine is only for you. Do not share this medicine with others. What if I miss a dose? If you miss a dose, take it as soon as you can. If it is almost time for your next dose, take only that dose. Do not take double or extra doses. What may interact with  this medicine? -alcohol -aspirin -cidofovir -diuretics -lithium -methotrexate -other drugs for inflammation like ketorolac or prednisone -pemetrexed -probenecid -warfarin This list may not describe all possible interactions. Give your health care provider a list of all the medicines, herbs, non-prescription drugs, or dietary supplements you use. Also tell them if you smoke, drink alcohol, or use illegal drugs. Some items may interact with your medicine. What should I watch for while using this medicine? Tell your doctor or health care professional if your pain does not get better. Talk to your doctor before taking another medicine for pain. Do not treat yourself. This medicine does not prevent heart attack or stroke. In fact, this medicine may increase the chance of a heart attack or stroke. The chance may increase with longer use of this medicine and in people who have heart disease. If you take aspirin to prevent heart attack or stroke, talk with your doctor or health care professional. Do not take other medicines that contain aspirin, ibuprofen, or naproxen with this medicine. Side effects such as stomach upset, nausea, or ulcers may be more likely to occur. Many medicines available without a prescription should not be taken with this medicine. This medicine can cause ulcers and bleeding in the stomach and intestines at any time during treatment. Do not smoke cigarettes or drink alcohol. These increase irritation to your stomach and can make it more susceptible to damage from this medicine. Ulcers and bleeding can happen without warning symptoms and can cause death. You may get drowsy or dizzy. Do not drive, use machinery, or do anything that needs mental alertness until you know how this medicine affects you. Do not stand or sit up quickly, especially if you are an older patient. This reduces the risk of dizzy or fainting spells. This medicine can cause you to bleed more easily. Try to avoid  damage to your teeth and gums when you brush or floss your teeth. What side effects may I notice from receiving this medicine? Side effects that you should report to your doctor or health care professional as soon as possible: -black or bloody stools, blood in the urine or vomit -blurred vision -chest pain -difficulty breathing or wheezing -nausea or vomiting -severe stomach pain -skin rash, skin redness, blistering or peeling skin, hives, or itching -slurred speech or weakness on one side of the body -swelling of eyelids, throat, lips -unexplained weight gain or swelling -unusually weak or tired -yellowing of eyes or skin Side effects that usually do not require medical attention (report to your doctor or health care professional if they continue or are bothersome): -constipation -headache -heartburn This list may not describe all possible side effects. Call your doctor for medical advice about side effects. You may report side effects to FDA at 1-800-FDA-1088. Where should I keep my medicine? Keep out of the reach of children. Store at room temperature between 15 and 30 degrees C (59 and 86 degrees F). Keep container tightly closed. Throw away any unused medicine after the expiration date. NOTE: This sheet is a summary. It may not cover all possible information. If you have questions about  this medicine, talk to your doctor, pharmacist, or health care provider.    2016, Elsevier/Gold Standard. (2009-01-30 20:10:16)

## 2015-01-08 NOTE — ED Notes (Signed)
Pt complaining of shortness of breath that started yesterday. Pain increased when takes a deep breath. Pt says having pain in neck & shoulders is more painful than normal.

## 2015-03-04 ENCOUNTER — Emergency Department (HOSPITAL_COMMUNITY)
Admission: EM | Admit: 2015-03-04 | Discharge: 2015-03-04 | Disposition: A | Discharge status: Home / Self Care | Payer: BLUE CROSS/BLUE SHIELD

## 2015-03-13 ENCOUNTER — Ambulatory Visit (HOSPITAL_COMMUNITY): Admission: RE | Admit: 2015-03-13 | Payer: BLUE CROSS/BLUE SHIELD | Source: Home / Self Care | Admitting: Psychiatry

## 2015-03-15 ENCOUNTER — Encounter: Payer: Self-pay | Admitting: Cardiovascular Disease

## 2015-03-15 ENCOUNTER — Ambulatory Visit (INDEPENDENT_AMBULATORY_CARE_PROVIDER_SITE_OTHER): Payer: BLUE CROSS/BLUE SHIELD | Admitting: Cardiovascular Disease

## 2015-03-15 VITALS — BP 126/82 | HR 80 | Ht 64.0 in | Wt 197.8 lb

## 2015-03-15 DIAGNOSIS — R0789 Other chest pain: Secondary | ICD-10-CM | POA: Diagnosis not present

## 2015-03-15 NOTE — Progress Notes (Signed)
03/15/2015 Kaitlin Meyers   05/18/1955  [id number]  Primary Physician Neldon Labella, MD Primary Cardiologist: Runell Gess MD Roseanne Reno   HPI:  Kaitlin Meyers is a 60 year old married Caucasian female self-referred accompanied by her husband Kaitlin Meyers today. She is here to evaluate atypical chest pain. She has no cardiac risk factors. There is no family history. Her mother who is in her 56s is a patient lying. I did perform cardiac catheterization on her that showed no evidence of CAD. She recently lost her sister who is chronically ill and who she had been taking care of her for years and is under a lot of stress. She admits to having panic attacks. She's had several episodes of atypical chest pain.   Current Outpatient Prescriptions  Medication Sig Dispense Refill  . aspirin EC 81 MG tablet Take 81 mg by mouth daily.    . busPIRone (BUSPAR) 5 MG tablet Take 5 mg by mouth 2 (two) times daily.  1  . cholecalciferol (VITAMIN D) 1000 UNITS tablet Take 1,000 Units by mouth daily.      Marland Kitchen EPINEPHrine (EPIPEN) 0.3 mg/0.3 mL SOAJ injection Inject 0.3 mLs (0.3 mg total) into the muscle as needed (as needed for severe allergic reaction). 1 Device 0  . hydroxypropyl methylcellulose (ISOPTO TEARS) 2.5 % ophthalmic solution Place 1 drop into both eyes daily as needed (Dry Eyes).    Marland Kitchen ibuprofen (ADVIL,MOTRIN) 200 MG tablet Take 600 mg by mouth every 6 (six) hours as needed for pain.    Marland Kitchen levothyroxine (SYNTHROID, LEVOTHROID) 112 MCG tablet Take 112 mcg by mouth every morning.  0  . naproxen (NAPROSYN) 500 MG tablet Take 1 tablet (500 mg total) by mouth 2 (two) times daily. 30 tablet 0  . [DISCONTINUED] diphenhydrAMINE (BENADRYL) 25 MG tablet Take 1 tablet (25 mg total) by mouth every 6 (six) hours. (Patient not taking: Reported on 10/03/2014) 20 tablet 0   No current facility-administered medications for this visit.    Allergies  Allergen Reactions  . Azithromycin Other (See Comments)    Pt states medication makes her have "hallucination"  . Bupropion Other (See Comments)  . Latex Hives    welps  . Penicillins Hives, Swelling and Other (See Comments)  . Sulfa Antibiotics Nausea Only    Social History   Social History  . Marital Status: Married    Spouse Name: N/A  . Number of Children: N/A  . Years of Education: N/A   Occupational History  . Not on file.   Social History Main Topics  . Smoking status: Never Smoker   . Smokeless tobacco: Never Used  . Alcohol Use: 0.0 oz/week    0 Glasses of wine per week  . Drug Use: No  . Sexual Activity: Yes    Birth Control/ Protection: Post-menopausal   Other Topics Concern  . Not on file   Social History Narrative     Review of Systems: General: negative for chills, fever, night sweats or weight changes.  Cardiovascular: negative for chest pain, dyspnea on exertion, edema, orthopnea, palpitations, paroxysmal nocturnal dyspnea or shortness of breath Dermatological: negative for rash Respiratory: negative for cough or wheezing Urologic: negative for hematuria Abdominal: negative for nausea, vomiting, diarrhea, bright red blood per rectum, melena, or hematemesis Neurologic: negative for visual changes, syncope, or dizziness All other systems reviewed and are otherwise negative except as noted above.    Blood pressure 126/82, pulse 80, height  (1.626 m), weight 197 lb 12.8  oz (89.721 kg).  General appearance: alert and no distress Neck: no adenopathy, no carotid bruit, no JVD, supple, symmetrical, trachea midline and thyroid not enlarged, symmetric, no tenderness/mass/nodules Lungs: clear to auscultation bilaterally Heart: regular rate and rhythm, S1, S2 normal, no murmur, click, rub or gallop Extremities: extremities normal, atraumatic, no cyanosis or edema  EKG sinus rhythm 81 without ST or T-wave changes. I personally reviewed this EKG  ASSESSMENT AND PLAN:   Atypical chest pain Sherisse self-referred  for atypical chest pain. She has no cardiac risk factors. She's been underlined stress with the recent loss of her sister. She admits to having anxiety disorder with panic attacks. She had an episode of chest pain was somewhat atypical. I'm going to get a routine GXT to risk stratify her.      Runell Gess MD FACP,FACC,FAHA, [facility] 03/15/2015 2:41 PM

## 2015-03-15 NOTE — Assessment & Plan Note (Signed)
Kaitlin Meyers self-referred for atypical chest pain. She has no cardiac risk factors. She's been underlined stress with the recent loss of her sister. She admits to having anxiety disorder with panic attacks. She had an episode of chest pain was somewhat atypical. I'm going to get a routine GXT to risk stratify her.

## 2015-03-15 NOTE — Patient Instructions (Signed)
Medication Instructions:  Your physician recommends that you continue on your current medications as directed. Please refer to the Current Medication list given to you today.   Labwork: Your physician recommends that you return for lab work in: FASTING (lipid/liver) The lab can be found on the FIRST FLOOR of out building in [address]   Testing/Procedures: Your physician has requested that you have an exercise tolerance test. For further information please visit [email or web address] Please also follow instruction sheet, as given.    Follow-Up: Follow up with Dr. Allyson Sabal as needed.   Any Other Special Instructions Will Be Listed Below (If Applicable).     If you need a refill on your cardiac medications before your next appointment, please call your pharmacy.

## 2015-03-28 ENCOUNTER — Telehealth (HOSPITAL_COMMUNITY): Payer: Self-pay

## 2015-03-28 NOTE — Telephone Encounter (Signed)
Encounter complete. 

## 2015-03-29 ENCOUNTER — Telehealth (HOSPITAL_COMMUNITY): Payer: Self-pay

## 2015-03-29 NOTE — Telephone Encounter (Signed)
Encounter complete. 

## 2015-03-30 ENCOUNTER — Inpatient Hospital Stay (HOSPITAL_COMMUNITY): Admission: RE | Admit: 2015-03-30 | Payer: Self-pay | Source: Ambulatory Visit

## 2015-04-20 ENCOUNTER — Inpatient Hospital Stay (HOSPITAL_COMMUNITY): Admission: RE | Admit: 2015-04-20 | Payer: Self-pay | Source: Ambulatory Visit

## 2015-04-25 ENCOUNTER — Telehealth (HOSPITAL_COMMUNITY): Payer: Self-pay

## 2015-04-25 NOTE — Telephone Encounter (Signed)
Encounter complete. 

## 2015-04-26 ENCOUNTER — Telehealth (HOSPITAL_COMMUNITY): Payer: Self-pay

## 2015-04-26 NOTE — Telephone Encounter (Signed)
Encounter complete. 

## 2015-04-27 ENCOUNTER — Inpatient Hospital Stay (HOSPITAL_COMMUNITY): Admission: RE | Admit: 2015-04-27 | Payer: Self-pay | Source: Ambulatory Visit

## 2015-07-07 ENCOUNTER — Other Ambulatory Visit: Payer: Self-pay | Admitting: Obstetrics and Gynecology

## 2015-07-07 DIAGNOSIS — R928 Other abnormal and inconclusive findings on diagnostic imaging of breast: Secondary | ICD-10-CM

## 2015-07-14 ENCOUNTER — Ambulatory Visit
Admission: RE | Admit: 2015-07-14 | Discharge: 2015-07-14 | Disposition: A | Discharge status: Home / Self Care | Payer: BLUE CROSS/BLUE SHIELD | Source: Ambulatory Visit | Attending: Obstetrics and Gynecology | Admitting: Obstetrics and Gynecology

## 2015-07-14 DIAGNOSIS — R928 Other abnormal and inconclusive findings on diagnostic imaging of breast: Secondary | ICD-10-CM

## 2017-06-11 ENCOUNTER — Ambulatory Visit: Payer: Self-pay | Admitting: Allergy & Immunology

## 2017-10-22 ENCOUNTER — Encounter: Payer: BLUE CROSS/BLUE SHIELD | Attending: Family Medicine | Admitting: Registered"

## 2017-10-22 ENCOUNTER — Encounter: Payer: Self-pay | Admitting: Registered"

## 2017-10-22 DIAGNOSIS — E039 Hypothyroidism, unspecified: Secondary | ICD-10-CM | POA: Diagnosis not present

## 2017-10-22 DIAGNOSIS — Z713 Dietary counseling and surveillance: Secondary | ICD-10-CM | POA: Diagnosis present

## 2017-10-22 DIAGNOSIS — Z6836 Body mass index (BMI) 36.0-36.9, adult: Secondary | ICD-10-CM | POA: Insufficient documentation

## 2017-10-22 DIAGNOSIS — E669 Obesity, unspecified: Secondary | ICD-10-CM | POA: Insufficient documentation

## 2017-10-22 NOTE — Progress Notes (Signed)
Medical Nutrition Therapy:  Appt start time: 1115 end time:  1230.   Assessment:  Primary concerns today: Pt referred for weight management. Pt present for appointment alone. Noted pt has been dx with hypothyroidism. Pt reports she would like help with being able to lose weight. Pt reports family hx of diabetes and heart disease. Pt reports she has had knee surgery and wants to lose ~30-50 lb. Pt reports she prescribed phentermine but was afraid to try it because she was afraid it may induce panic attacks. Pt reports her cousin came here to [facility] in the past and lost ~50 lb. She reports that she has asked her cousin for pointers for losing weight. Pt reports she has tried [employer] in the past and felt she was following their instructions but lost much less weight than her friend she was in the program with. Pt reports that she lost around 12 lb while doing that program and her friend lost 50 lb. Pt reports she has lost around 3 lb since making some recent dietary changes. Pt reports she has stopped drinking diet sodas and that has helped with reducing her appetite. Pt reports drinking other unsweetened drinks or those with Stevia. Pt has also started having oatmeal for breakfast with plain [place] yogurt and fruit and has been having [place] Triple Zero yogurt at night. Pt reports that she has tried to cut out bread and sugar. Has been still having some wraps and Joseph pita bread. Pt reports she would like help with quick meals and tips for healthy eating out. Pt also reports that she craves sodium and also sometimes sweets. Reports she sweats a lot and feels she needs more potassium and/or salt than most.   Food Allergies/Intolerances: peanuts-pt reports having a reaction to peanuts. Was tested and had a low positive result and was recommended to do an in office oral challenge by her allergist per pt. Pt has not yet had the oral challenge for peanuts. Pt has been avoiding peanuts and tree nuts.   Noted  Lab Values: 08/18/17:  LDL: 132  Preferred Learning Style:   No preference indicated   Learning Readiness:   Ready  MEDICATIONS: See list.    DIETARY INTAKE:  Usual eating pattern includes 3 meals and 1-3 snacks per day. Reports wanting to snack at night time. Reports sometimes skipping a meal if she gets busy during the day. Typical snacks Triple Zero yogurt, hummus with carrots.   Everyday foods include oatmeal, Triple Zero [place] yogurt.  Avoided foods include peanuts, tree nuts.    24-hr recall:  B ( AM): oatmeal with tbsp plain [place] yogurt, Magic syrup, and monk fruit sweetener, coffee (Coffee Mate creamer OR stevia and skim milk), water  Snk ( AM): None reported.   L ( PM): small amount of egg salad, left over whole wheat pasta, chicken with lowfat alfredo sauce, tomatoes, water  Snk ( PM): small amount of diet Dr. Reino Kent, left over pasta; handful of edamame; 1 dark chocolate square D (830 PM): 2 pieces of fried flounder, baked sweet potato, water Snk ( PM): None reported  Beverages: coffee, Diet Dr. Reino Kent, reports she tries to drink 5 bottles of water daily but often has around 3-4 bottles (48-64 oz) per day.   Usual physical activity: [facility] joined senior center. Has been doing weight room 3 days per week and pickle ball 2 days per week. Has been doing around 30 minutes of cardio exercise about 3 days per week.  Progress Towards Goal(s):  In progress.   Nutritional Diagnosis:  NB-1.1 Food and nutrition-related knowledge deficit As related to balanced nutrition and mindful eating .  As evidenced by pt has questions regarding what foods she should include to promote health/weight loss; pt reports eating fast at meals.    Intervention:  Nutrition counseling provided. Dietitian provided education regarding balanced nutrition and mindful eating. Discussed importance of focusing on healthy habits that promote overall health-balanced nutrition and regular physical  activity-rather than focusing on weight. Dietitian discussed satisfying snacks-including good sources of protein and fiber. Dietitian provided education on choosing balanced meals when eating out. Encouraged pt to continue with regular physical activity plan she has started-at least 30 minutes 5 days of week. Encouraged pt to include at least 4 bottles (64 oz) of water daily. Pt appeared agreeable to information/goals discussed.   Instructions/Goals:  Make sure to get in three meals per day. Try to have balanced meals like the My Plate example (see handout). Include lean proteins, vegetables, fruits, and whole grains at meals.    Recommend having healthy and filling snacks prepped ahead of time. Recommend measuring out dips if consuming more than you want if difficult. Could try Pilgrim's Pride and sunbutter as snacks. Protein and fiber help make snacks more filling and satisfying.   Calorieking.com for help with selecting foods at restaurants. When eating out-think about places that provide good vegetable, whole grain, and lean protein options like the balanced plate.   Practice Mindful Eating  At meal and snack times, put away electronics (TV, phone, tablet, etc.) and try to eat seated at a table so you can better focus on eating your meal/snack and promote listening to your body's fullness and hunger signals.  If you feel that you are wanting to snack because your are bored or due to emotions and not because you are hungry, try to do a fun activity (read a book, take a walk, talk with a friend, etc.) for at least 20 minutes.   Make physical activity a part of your week. Try to include at least 30 minutes of physical activity 5 days each week or at least 150 minutes per week. Regular physical activity promotes overall health-including helping to reduce risk for heart disease and diabetes, promoting mental health, and helping [place] sleep better.    Continue working to include regular  activity.   Teaching Method Utilized:  Visual Auditory  Handouts given during visit include:  Balanced plate and food list.   Barriers to learning/adherence to lifestyle change: None indicated.   Demonstrated degree of understanding via:  Teach Back   Monitoring/Evaluation:  Dietary intake, exercise, and body weight in 4 week(s).

## 2017-10-22 NOTE — Patient Instructions (Addendum)
Instructions/Goals:  Make sure to get in three meals per day. Try to have balanced meals like the My Plate example (see handout). Include lean proteins, vegetables, fruits, and whole grains at meals.    Recommend having healthy and filling snacks prepped ahead of time. Recommend measuring out dips if consuming more than you want if difficult. Could try Pilgrim's Pride and sunbutter as snacks. Protein and fiber help make snacks more filling and satisfying.   Calorieking.com for help with selecting foods at restaurants. When eating out-think about places that provide good vegetable, whole grain, and lean protein options like the balanced plate.   Practice Mindful Eating  At meal and snack times, put away electronics (TV, phone, tablet, etc.) and try to eat seated at a table so you can better focus on eating your meal/snack and promote listening to your body's fullness and hunger signals.  If you feel that you are wanting to snack because your are bored or due to emotions and not because you are hungry, try to do a fun activity (read a book, take a walk, talk with a friend, etc.) for at least 20 minutes.   Make physical activity a part of your week. Try to include at least 30 minutes of physical activity 5 days each week or at least 150 minutes per week. Regular physical activity promotes overall health-including helping to reduce risk for heart disease and diabetes, promoting mental health, and helping [place] sleep better.    Continue working to include regular activity.

## 2017-11-20 ENCOUNTER — Encounter: Payer: BLUE CROSS/BLUE SHIELD | Attending: Family Medicine | Admitting: Registered"

## 2017-11-20 DIAGNOSIS — Z713 Dietary counseling and surveillance: Secondary | ICD-10-CM | POA: Insufficient documentation

## 2017-11-20 DIAGNOSIS — E039 Hypothyroidism, unspecified: Secondary | ICD-10-CM | POA: Diagnosis not present

## 2017-11-20 DIAGNOSIS — E669 Obesity, unspecified: Secondary | ICD-10-CM | POA: Insufficient documentation

## 2017-11-20 DIAGNOSIS — Z6836 Body mass index (BMI) 36.0-36.9, adult: Secondary | ICD-10-CM | POA: Diagnosis not present

## 2017-11-20 NOTE — Patient Instructions (Addendum)
Instructions/Goals:    Have 3 meals each day.   Recommend having vegetables on hand at home that last longer such as in frozen form or canned without sodium. Having beans on hand can help with having a shelf safe protein source.   Keep balanced snacks on hand (good source of protein, fiber) in car, purse, etc when on the go. If meals are spaced farther apart-have a balanced snack to help keep appetite at normal level.   Keep water on hand. Have at least 4 bottles of water per day (64 oz).   When eating out-use calorieking.com to help with choosing more beneficial entrees  Go for dishes with lean proteins, whole grains, and good sources of vegetables majority of the time.    Don't be afraid to ask for substitutions  Make physical activity a part of your week. Try to include at least 30 minutes of physical activity 5 days each week or at least 150 minutes per week. Regular physical activity promotes overall health-including helping to reduce risk for heart disease and diabetes, promoting mental health, and helping [place] sleep better.  Starting Goal: At least 3 days of physical activity per week.

## 2017-11-20 NOTE — Progress Notes (Signed)
Medical Nutrition Therapy:  Appt start time: 1131 end time:  1220.  Assessment:  Primary concerns today: Pt referred for weight management. Pt present for appointment alone. Noted pt has been dx with hypothyroidism. Pt reports that she had lost around 6 lb after making changes (increased activity and dietary changes) but reports she gained the weight back after being sick for 3 weeks with an upper respiratory illness. Pt reports she had purchased some healthier snacks (edamame, chickpeas, cottage cheese and fruit, Triple Zero yogurt, etc) and was doing well with having healthy snacks and being more active through the senior center prior to becoming sick as well as having a lot of family things going on. Pt reports that she has been afraid to weigh because if she gains back weight lost it makes her feel like the changes she originally made were pointless. Pt reports that she eats out a lot and would like help with how to eat out healthfully. Pt reports that she and her husband are going to the beach within the next week and she knows the food there will not be very healthy. Pt reports she hopes to get back on track with her eating when she gets back from the beach and she plans to go the store after today's appointment to restock on healthy snack foods.   Food Allergies/Intolerances: peanuts-pt reports having a reaction to peanuts. Was tested and had a low positive result and was recommended to do an in office oral challenge by her allergist per pt. Pt has not yet had the oral challenge for peanuts. Pt has been avoiding peanuts and tree nuts.   Noted Lab Values: 08/18/17:  LDL: 132  Preferred Learning Style:   No preference indicated   Learning Readiness:   Ready  MEDICATIONS: See list.    DIETARY INTAKE:  Usual eating pattern includes 3 meals and 1-3 snacks per day. Reports sometimes skipping a meal if she gets busy during the day. Typical snacks Triple Zero yogurt, hummus with carrots.    Everyday foods include oatmeal, Triple Zero [place] yogurt.  Avoided foods include peanuts, tree nuts.    24-hr recall:  B ( AM): None reported.  Snk ( AM): None reported.   L ( PM): Chick fil a-fried chicken sandwich, fries, Coke (pt reports she was very hungry after skipping breakfast due to being busy) Snk ( PM): None reported.  D (PM): 1/2 bowl crab soup, snacked for rest of meal-crackers, cheese, chocolate, popcorn (pt reports she did not like the soup and ended up snacking and then struggling to feel satisfied/full from snack foods) Snk ( PM): None reported  Beverages: Coke; 2 bottles of water  Usual physical activity: Was doing activities at the [facility] center 3 days per week but has not been doing so since she got sick.   Progress Towards Goal(s):  In progress.   Nutritional Diagnosis:  NB-1.1 Food and nutrition-related knowledge deficit As related to balanced nutrition and mindful eating .  As evidenced by pt has questions regarding what foods she should include to promote health/weight loss; pt reports eating fast at meals.    Intervention:  Nutrition counseling provided. Dietitian discussed with pt that even if weight lost has been regained that does not mean that positive changes pt was incorporating prior to sickness were erased. Discussed how including balanced nutrition and regular physical activity are beneficial to health with or without weight loss. Discussed importance of not allowing past food choices to make one feel bad,  but to instead think of them as learning opportunities to use to promote more beneficial choices in the future. Pt reported that on previous day she ordered differently because she was really hungry. Discussed what led to pt feeling overly hungry on previous day and how she can help to prevent the same outcome next time (having breakfast, carrying a snack in the car, etc). Discussed moderation with pt going to the beach, that we can enjoy treats in  moderation and how restricting ourselves from ever having a food we enjoy can lead to overindulgence later. Discussed how to eat healthfully at restaurants. Encouraged pt to plan out and purchase snacks ahead of time and keep them with her in purse and/or care to help prevent pt from going too long without eating due to busy schedule and becoming overly hungry later. Discussed foods to keep on hand at home for last minute meals. Also recommended keeping a water bottle with her to help increase water intake. Pt appeared agreeable to information/goals discussed.   Instructions/Goals:   Have 3 meals each day.   Recommend having vegetables on hand at home that last longer such as in frozen form or canned without sodium. Having beans on hand can help with having a shelf safe protein source.   Keep balanced snacks on hand (good source of protein, fiber) in car, purse, etc when on the go. If meals are spaced farther apart-have a balanced snack to help keep appetite at normal level.   Keep water on hand. Have at least 4 bottles of water per day (64 oz).   When eating out-use calorieking.com to help with choosing more beneficial entrees  Go for dishes with lean proteins, whole grains, and good sources of vegetables majority of the time.    Don't be afraid to ask for substitutions  Make physical activity a part of your week. Try to include at least 30 minutes of physical activity 5 days each week or at least 150 minutes per week. Regular physical activity promotes overall health-including helping to reduce risk for heart disease and diabetes, promoting mental health, and helping [place] sleep better.  Starting Goal: At least 3 days of physical activity per week.  Teaching Method Utilized:  Visual Auditory  Handouts given during visit include:  Balanced plate and food list.   Barriers to learning/adherence to lifestyle change: None indicated.   Demonstrated degree of understanding via:  Teach Back    Monitoring/Evaluation:  Dietary intake, exercise, and body weight in 1 month(s).

## 2017-11-21 ENCOUNTER — Encounter: Payer: Self-pay | Admitting: Registered"

## 2017-12-18 ENCOUNTER — Ambulatory Visit: Payer: Self-pay | Admitting: Registered"

## 2018-03-31 ENCOUNTER — Other Ambulatory Visit (HOSPITAL_COMMUNITY): Payer: Self-pay | Admitting: Physician Assistant

## 2018-03-31 DIAGNOSIS — R609 Edema, unspecified: Secondary | ICD-10-CM

## 2018-03-31 DIAGNOSIS — R52 Pain, unspecified: Secondary | ICD-10-CM

## 2018-04-02 ENCOUNTER — Ambulatory Visit (HOSPITAL_COMMUNITY)
Admission: RE | Admit: 2018-04-02 | Discharge: 2018-04-02 | Disposition: A | Discharge status: Home / Self Care | Payer: BLUE CROSS/BLUE SHIELD | Source: Ambulatory Visit | Attending: Physician Assistant | Admitting: Physician Assistant

## 2018-04-02 DIAGNOSIS — R52 Pain, unspecified: Secondary | ICD-10-CM | POA: Diagnosis present

## 2018-04-02 DIAGNOSIS — R609 Edema, unspecified: Secondary | ICD-10-CM

## 2018-08-25 ENCOUNTER — Other Ambulatory Visit: Payer: Self-pay | Admitting: Family Medicine

## 2018-08-27 ENCOUNTER — Other Ambulatory Visit: Payer: Self-pay | Admitting: Family Medicine

## 2019-02-11 ENCOUNTER — Telehealth: Payer: Self-pay | Admitting: Cardiovascular Disease

## 2019-02-11 NOTE — Telephone Encounter (Signed)
Spoke to pt who says that she is feeling SOB but does not know if it is from her anxiety. She states its been a bad year and she has gained some weight d/t knee surgery and not being able to exercise. Asked if she has had a sudden increase in weight gain, denied. She does not sound SOB on the phone. Asked pt if she was having any other symptoms and denied. Asked about swelling, denied. Stated that she wants to have a stress test. Advised pt to keep appt with Dr Gwenlyn Found on 02/16/19 and to call 911 if she becomes SOB and it sustains to call 911. Verbalized understanding.

## 2019-02-16 ENCOUNTER — Encounter: Payer: Self-pay | Admitting: Cardiovascular Disease

## 2019-02-16 ENCOUNTER — Ambulatory Visit: Payer: BC Managed Care – PPO | Admitting: Cardiovascular Disease

## 2019-02-16 ENCOUNTER — Other Ambulatory Visit: Payer: Self-pay

## 2019-02-16 DIAGNOSIS — R0600 Dyspnea, unspecified: Secondary | ICD-10-CM

## 2019-02-16 DIAGNOSIS — R0789 Other chest pain: Secondary | ICD-10-CM

## 2019-02-16 DIAGNOSIS — R0609 Other forms of dyspnea: Secondary | ICD-10-CM

## 2019-02-16 NOTE — Patient Instructions (Signed)
Medication Instructions:  Your physician recommends that you continue on your current medications as directed. Please refer to the Current Medication list given to you today.  If you need a refill on your cardiac medications before your next appointment, please call your pharmacy.   Lab work: Fasting Lipid and Liver Panel If you have labs (blood work) drawn today and your tests are completely normal, you will receive your results only by: MyChart Message (if you have MyChart) OR A paper copy in the mail If you have any lab test that is abnormal or we need to change your treatment, we will call you to review the results.  Testing/Procedures: Your physician has requested that you have an echocardiogram. Echocardiography is a painless test that uses sound waves to create images of your heart. It provides your doctor with information about the size and shape of your heart and how well your heart's chambers and valves are working. This procedure takes approximately one hour. There are no restrictions for this procedure. [address]  AND  Coronary Calcium Score  Follow-Up: At [facility], you and your health needs are our priority.  As part of our continuing mission to provide you with exceptional heart care, we have created designated Provider Care Teams.  These Care Teams include your primary Cardiologist (physician) and Advanced Practice Providers (APPs -  Physician Assistants and Nurse Practitioners) who all work together to provide you with the care you need, when you need it. You may see Dr Allyson Sabal or one of the following Advanced Practice Providers on your designated Care Team:    Corine Shelter, PA-C  [place], [place]  Edd Fabian, [place]  Your physician wants you to follow-up as needed.  Any Other Special Instructions Will Be Listed Below (If Applicable).  Coronary Calcium Scan A coronary calcium scan is an imaging test used to look for deposits of plaque  in the inner lining of the blood vessels of the heart (coronary arteries). Plaque is made up of calcium, protein, and fatty substances. These deposits of plaque can partly clog and narrow the coronary arteries without producing any symptoms or warning signs. This puts a person at risk for a heart attack. This test is recommended for people who are at moderate risk for heart disease. The test can find plaque deposits before symptoms develop. Tell a health care provider about:  Any allergies you have.  All medicines you are taking, including vitamins, herbs, eye drops, creams, and over-the-counter medicines.  Any problems you or family members have had with anesthetic medicines.  Any blood disorders you have.  Any surgeries you have had.  Any medical conditions you have.  Whether you are pregnant or may be pregnant. What are the risks? Generally, this is a safe procedure. However, problems may occur, including:  Harm to a pregnant woman and her unborn baby. This test involves the use of radiation. Radiation exposure can be dangerous to a pregnant woman and her unborn baby. If you are pregnant or think you may be pregnant, you should not have this procedure done.  Slight increase in the risk of cancer. This is because of the radiation involved in the test. What happens before the procedure? Ask your health care provider for any specific instructions on how to prepare for this procedure. You may be asked to avoid products that contain caffeine, tobacco, or nicotine for 4 hours before the procedure. What happens during the procedure?   You will undress and  remove any jewelry from your neck or chest.  You will put on a hospital gown.  Sticky electrodes will be placed on your chest. The electrodes will be connected to an electrocardiogram (ECG) machine to record a tracing of the electrical activity of your heart.  You will lie down on a curved bed that is attached to the [place].  You  may be given medicine to slow down your heart rate so that clear pictures can be created.  You will be moved into the CT scanner, and the CT scanner will take pictures of your heart. During this time, you will be asked to lie still and hold your breath for 2-3 seconds at a time while each picture of your heart is being taken. The procedure may vary among health care providers and hospitals. What happens after the procedure?  You can get dressed.  You can return to your normal activities.  It is up to you to get the results of your procedure. Ask your health care provider, or the department that is doing the procedure, when your results will be ready. Summary  A coronary calcium scan is an imaging test used to look for deposits of plaque in the inner lining of the blood vessels of the heart (coronary arteries). Plaque is made up of calcium, protein, and fatty substances.  Generally, this is a safe procedure. Tell your health care provider if you are pregnant or may be pregnant.  Ask your health care provider for any specific instructions on how to prepare for this procedure.  A CT scanner will take pictures of your heart.  You can return to your normal activities after the scan is done. This information is not intended to replace advice given to you by your health care provider. Make sure you discuss any questions you have with your health care provider. Document Revised: 08/18/2018 Document Reviewed: 08/18/2018 Elsevier Patient Education  [place].

## 2019-02-16 NOTE — Progress Notes (Signed)
02/16/2019 Gregary Cromer   07-06-1955  [id number]  Primary Physician Caryl Bis, MD Primary Cardiologist: Lorretta Harp MD Garret Reddish, [place], [place]  HPI:  Kaitlin Meyers is a 64 y.o.  married Caucasian female who was initially self-referred to evaluate atypical chest pain. She has no cardiac risk factors. There is no family history. Her mother who is in her 32s is a patient lying. I did perform cardiac catheterization on her that showed no evidence of CAD. She recently lost her sister who is chronically ill and who she had been taking care of her for years and is under a lot of stress. She admits to having panic attacks. She's had several episodes of atypical chest pain.  Since I saw her 4 years ago she has had trauma to her left knee requiring left total knee replacement.  She is gained 25 pounds as a result of inactivity has complained of increasing dyspnea on exertion but denies chest pain.    Current Meds  Medication Sig  . cholecalciferol (VITAMIN D) 1000 UNITS tablet Take 1,000 Units by mouth daily.    . Cholecalciferol (VITAMIN D) 2000 units CAPS Take by mouth.  . clonazePAM (KLONOPIN) 0.5 MG tablet Take 0.5 mg by mouth.  . DULoxetine HCl (CYMBALTA PO) Take by mouth.  . EPINEPHrine (EPIPEN) 0.3 mg/0.3 mL SOAJ injection Inject 0.3 mLs (0.3 mg total) into the muscle as needed (as needed for severe allergic reaction).  Marland Kitchen levothyroxine (SYNTHROID, LEVOTHROID) 112 MCG tablet Take 112 mcg by mouth every morning.  . [DISCONTINUED] aspirin EC 81 MG tablet Take 81 mg by mouth daily.  . [DISCONTINUED] busPIRone (BUSPAR) 5 MG tablet Take 5 mg by mouth 2 (two) times daily.  . [DISCONTINUED] hydroxypropyl methylcellulose (ISOPTO TEARS) 2.5 % ophthalmic solution Place 1 drop into both eyes daily as needed (Dry Eyes).  . [DISCONTINUED] ibuprofen (ADVIL,MOTRIN) 200 MG tablet Take 600 mg by mouth every 6 (six) hours as needed for pain.  . [DISCONTINUED] naproxen (NAPROSYN) 500 MG  tablet Take 1 tablet (500 mg total) by mouth 2 (two) times daily.     Allergies  Allergen Reactions  . Latex Hives    welps  . Azithromycin Other (See Comments)    Pt states medication makes her have "hallucination"  . Bupropion Other (See Comments)  . Other     Pt reports not being able to take multiple pain medications. Unsure of drug names.   . Peanut-Containing Drug Products     peanuts  . Penicillins Hives, Swelling and Other (See Comments)  . Sulfa Antibiotics Nausea Only  . Peanut Oil Palpitations    Social History   Socioeconomic History  . Marital status: Married    Spouse name: Not on file  . Number of children: Not on file  . Years of education: Not on file  . Highest education level: Not on file  Occupational History  . Not on file  Tobacco Use  . Smoking status: Never Smoker  . Smokeless tobacco: Never Used  Substance and Sexual Activity  . Alcohol use: Yes    Alcohol/week: 0.0 standard drinks  . Drug use: No  . Sexual activity: Yes    Birth control/protection: Post-menopausal  Other Topics Concern  . Not on file  Social History Narrative  . Not on file   Social Determinants of Health   Financial Resource Strain:   . Difficulty of Paying Living Expenses: Not on file  Food Insecurity:   .  Worried About Programme researcher, broadcasting/film/video in the Last Year: Not on file  . Ran Out of Food in the Last Year: Not on file  Transportation Needs:   . Lack of Transportation (Medical): Not on file  . Lack of Transportation (Non-Medical): Not on file  Physical Activity:   . Days of Exercise per Week: Not on file  . Minutes of Exercise per Session: Not on file  Stress:   . Feeling of Stress : Not on file  Social Connections:   . Frequency of Communication with Friends and Family: Not on file  . Frequency of Social Gatherings with Friends and Family: Not on file  . Attends Religious Services: Not on file  . Active Member of Clubs or Organizations: Not on file  . Attends  Banker Meetings: Not on file  . Marital Status: Not on file  Intimate Partner Violence:   . Fear of Current or Ex-Partner: Not on file  . Emotionally Abused: Not on file  . Physically Abused: Not on file  . Sexually Abused: Not on file     Review of Systems: General: negative for chills, fever, night sweats or weight changes.  Cardiovascular: negative for chest pain, dyspnea on exertion, edema, orthopnea, palpitations, paroxysmal nocturnal dyspnea or shortness of breath Dermatological: negative for rash Respiratory: negative for cough or wheezing Urologic: negative for hematuria Abdominal: negative for nausea, vomiting, diarrhea, bright red blood per rectum, melena, or hematemesis Neurologic: negative for visual changes, syncope, or dizziness All other systems reviewed and are otherwise negative except as noted above.    Blood pressure (!) 141/85, pulse 94, temperature (!) 97.1 F (36.2 C), height 5\' 4"  (1.626 m), weight 220 lb (99.8 kg), SpO2 97 %.  General appearance: alert and no distress Neck: no adenopathy, no carotid bruit, no JVD, supple, symmetrical, trachea midline and thyroid not enlarged, symmetric, no tenderness/mass/nodules Lungs: clear to auscultation bilaterally Heart: regular rate and rhythm, S1, S2 normal, no murmur, click, rub or gallop Extremities: extremities normal, atraumatic, no cyanosis or edema Pulses: 2+ and symmetric Skin: Skin color, texture, turgor normal. No rashes or lesions Neurologic: Alert and oriented X 3, normal strength and tone. Normal symmetric reflexes. Normal coordination and gait  EKG sinus rhythm at 94 without ST or T wave changes. I  Personally reviewed this EKG.  ASSESSMENT AND PLAN:   Atypical chest pain History of atypical chest pain in the past without functional study or cath thought to be related to anxiety/panic attacks  Dyspnea on exertion History of dyspnea on exertion which has been more recognizable over the  last several months especially since she had a left total knee replacement and has been somewhat inactive.  She is gained 25 pounds.  I am to get a 2D echocardiogram and a coronary calcium score to further evaluate      MD [facility], [facility] 02/16/2019 3:42 PM

## 2019-02-16 NOTE — Assessment & Plan Note (Signed)
History of atypical chest pain in the past without functional study or cath thought to be related to anxiety/panic attacks

## 2019-02-16 NOTE — Assessment & Plan Note (Signed)
History of dyspnea on exertion which has been more recognizable over the last several months especially since she had a left total knee replacement and has been somewhat inactive.  She is gained 25 pounds.  I am to get a 2D echocardiogram and a coronary calcium score to further evaluate

## 2019-03-01 ENCOUNTER — Ambulatory Visit (HOSPITAL_COMMUNITY): Payer: BC Managed Care – PPO | Attending: Cardiovascular Disease

## 2019-03-01 ENCOUNTER — Other Ambulatory Visit: Payer: Self-pay

## 2019-03-01 ENCOUNTER — Ambulatory Visit (INDEPENDENT_AMBULATORY_CARE_PROVIDER_SITE_OTHER)
Admission: RE | Admit: 2019-03-01 | Discharge: 2019-03-01 | Disposition: A | Discharge status: Home / Self Care | Payer: Self-pay | Source: Ambulatory Visit | Attending: Cardiovascular Disease | Admitting: Cardiovascular Disease

## 2019-03-01 DIAGNOSIS — R0609 Other forms of dyspnea: Secondary | ICD-10-CM

## 2019-03-01 DIAGNOSIS — R0789 Other chest pain: Secondary | ICD-10-CM | POA: Diagnosis present

## 2019-03-01 DIAGNOSIS — R0600 Dyspnea, unspecified: Secondary | ICD-10-CM | POA: Diagnosis not present

## 2019-03-04 ENCOUNTER — Telehealth: Payer: Self-pay | Admitting: *Deleted

## 2019-03-04 NOTE — Telephone Encounter (Signed)
Patient made aware of results and verbalized understanding of ct cardiac scoring.  Runell Gess, MD  03/01/2019 12:53 PM EST    Cor CA Score 0. No sCAD

## 2020-01-21 DIAGNOSIS — E039 Hypothyroidism, unspecified: Secondary | ICD-10-CM | POA: Insufficient documentation

## 2020-08-25 ENCOUNTER — Ambulatory Visit: Payer: BLUE CROSS/BLUE SHIELD | Admitting: Allergy & Immunology

## 2020-10-11 ENCOUNTER — Ambulatory Visit (INDEPENDENT_AMBULATORY_CARE_PROVIDER_SITE_OTHER): Payer: 59 | Admitting: Allergy & Immunology

## 2020-10-11 ENCOUNTER — Other Ambulatory Visit: Payer: Self-pay

## 2020-10-11 ENCOUNTER — Encounter: Payer: Self-pay | Admitting: Allergy & Immunology

## 2020-10-11 VITALS — BP 126/80 | HR 91 | Temp 97.2°F | Resp 16 | Ht 64.0 in | Wt 225.2 lb

## 2020-10-11 DIAGNOSIS — T7800XD Anaphylactic reaction due to unspecified food, subsequent encounter: Secondary | ICD-10-CM | POA: Diagnosis not present

## 2020-10-11 NOTE — Patient Instructions (Signed)
1. Anaphylactic shock due to food - Testing was negative to peanuts and tree nuts. - We are going to get blood testing to confirm this. - Make an appointment in 2-4 weeks for a peanut butter challenge in the office.   2. Follow up in 2-4 weeks for peanut challenge.    Please inform us of any Emergency Department visits, hospitalizations, or changes in symptoms. Call us before going to the ED for breathing or allergy symptoms since we might be able to fit you in for a sick visit. Feel free to contact us anytime with any questions, problems, or concerns.  It was a pleasure to meet you today!  Websites that have reliable patient information: 1. American Academy of Asthma, Allergy, and Immunology: www.aaaai.org 2. Food Allergy Research and Education (FARE): foodallergy.org 3. Mothers of Asthmatics: http://www.asthmacommunitynetwork.org 4. American College of Allergy, Asthma, and Immunology: www.acaai.org   COVID-19 Vaccine Information can be found at: [email or web address].[email or web address] For questions related to vaccine distribution or appointments, please email [email or web address] or call [phone].   We realize that you might be concerned about having an allergic reaction to the COVID19 vaccines. To help with that concern, WE ARE OFFERING THE COVID19 VACCINES IN OUR OFFICE! Ask the front desk for dates!     "Like" [place] on Facebook and Instagram for our latest updates!      A healthy democracy works best when [employer] participate! Make sure you are registered to vote! If you have moved or changed any of your contact information, you will need to get this updated before voting!  In some cases, you MAY be able to register to vote online: [email or web address]

## 2020-10-11 NOTE — Progress Notes (Signed)
NEW PATIENT  Date of Service/Encounter:  10/11/20  Consult requested by: Richardean Chimera, MD   Assessment:   Anaphylactic shock due to food  Preference for leopard prints  Plan/Recommendations:   1. Anaphylactic shock due to food - Testing was negative to peanuts and tree nuts. - We are going to get blood testing to confirm this. - Make an appointment in 2-4 weeks for a peanut butter challenge in the office.   2. Follow up in 2-4 weeks for peanut challenge.     This note in its entirety was forwarded to the Provider who requested this consultation.  Subjective:   Kaitlin Meyers is a 65 y.o. female presenting today for evaluation of  Chief Complaint  Patient presents with   Allergic Reaction    Peanuts  and tree nuts - has been avoiding them for years    Allergy Testing    For peanuts and tree nuts only     Kaitlin Meyers has a history of the following: Patient Active Problem List   Diagnosis Date Noted   Dyspnea on exertion 02/16/2019   Atypical chest pain 03/15/2015   Acquired trigger finger 01/22/2012   Hand pain 01/22/2012   Abnormality of gait 02/08/2011   Pain in joint, lower leg 02/08/2011   Muscle weakness (generalized) 02/08/2011   Limitation of joint motion 02/08/2011    History obtained from: chart review and patient.  Kaitlin Meyers was referred by Richardean Chimera, MD.     Kaitlin Meyers is a 65 y.o. female presenting for an evaluation of nut allergies .  She had allergy testing done by Dr. Willa Rough years ago. She has been avoiding peanuts because it came up positive. A food challenge was recommended, but she never went through with it. She wanted to get a test to rule this out. She otherwise tolerates all of the other food allergens without improvement in her symptoms. She does report a hive on her arm that she noticed when she was eating a high soy diet with a supplement. She did get a triamcinolone and she tolerated this well.   Her original  reaction was when she was eating something with nuts in it. She reported some tongue irritation. She panicked at that point in time. She has been avoiding all peanuts and tree nuts since that time. She thinks that it might have been just related to her anxiety. She did stop at a [facility] once and told the Pharmacist that she might be allergic to nuts. She was given Benadryl in the pharmacy with improvement in her symptoms. The PharmD felt that she was fine. She sat there for 30-45 minutes and she did not have any problems. She did drive home post Benadryl. She has a very old EpiPen that Dr. Willa Rough gave her.  She is currently on Cymbalta and clonazepam daily for her anxiety. This is under good control at this point in time.    She is retired but worked for [employer] for years. 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 She worked out of [employer].  Her daughter still lives in [place] and she has 2 grandchildren down there, so she does still travel to [employer].  However, she has lived in [place] for 15 years and has grown to love it.  She does not have any environmental allergies.  She has no asthma.  Otherwise, there is no history of other atopic diseases, including asthma, drug allergies, environmental  allergies, stinging insect allergies, eczema, urticaria, or contact dermatitis. There is no significant infectious history. Vaccinations are up to date.    Past Medical History: Patient Active Problem List   Diagnosis Date Noted   Dyspnea on exertion 02/16/2019   Atypical chest pain 03/15/2015   Acquired trigger finger 01/22/2012   Hand pain 01/22/2012   Abnormality of gait 02/08/2011   Pain in joint, lower leg 02/08/2011   Muscle weakness (generalized) 02/08/2011   Limitation of joint motion 02/08/2011    Medication List:  Allergies as of 10/11/2020       Reactions   Peanut Allergen Powder-dnfp Palpitations, Swelling   Sulfa Antibiotics Nausea Only, Swelling   Latex  Hives   welps   Azithromycin Other (See Comments)   Pt states medication makes her have "hallucination"   Bupropion Other (See Comments)   Other    Pt reports not being able to take multiple pain medications. Unsure of drug names.    Peanut-containing Drug Products    peanuts   Penicillins Hives, Swelling, Other (See Comments)   Azo Yeast Plus [traumeel] Anxiety   Peanut Oil Palpitations        Medication List        Accurate as of October 11, 2020  1:18 PM. If you have any questions, ask your nurse or doctor.          Vitamin D 50 MCG (2000 UT) Caps Take by mouth.   cholecalciferol 1000 units tablet Commonly known as: VITAMIN D Take 1,000 Units by mouth daily.   clonazePAM 0.5 MG tablet Commonly known as: KLONOPIN Take 0.5 mg by mouth.   CYMBALTA PO Take by mouth.   EPINEPHrine 0.3 mg/0.3 mL Soaj injection Commonly known as: EpiPen Inject 0.3 mLs (0.3 mg total) into the muscle as needed (as needed for severe allergic reaction).   levothyroxine 112 MCG tablet Commonly known as: SYNTHROID Take 112 mcg by mouth every morning.        Birth History: non-contributory  Developmental History: non-contributory  Past Surgical History: Past Surgical History:  Procedure Laterality Date   BLADDER SURGERY  2011   "mesh"   CERVICAL DISC SURGERY  2007   KNEE ARTHROSCOPY  01/2010   right   PARTIAL KNEE ARTHROPLASTY  01/14/2011   Procedure: UNICOMPARTMENTAL KNEE;  Surgeon: Raymon MuttonStephen D Lucey, MD;  Location: [facility] OR;  Service: Orthopedics;  Laterality: Right;   REPLACEMENT UNICONDYLAR JOINT KNEE  01/14/11   "partial right medial replacement"   TEMPOROMANDIBULAR JOINT SURGERY  1987   VAGINAL HYSTERECTOMY  2011     Family History: Family History  Problem Relation Age of Onset   Arthritis Other    Diabetes Other    Stroke Paternal Grandmother    Diabetes Paternal Grandmother      Social History: Kaitlin Landngela lives at home with her family. They live in a house that is  66 years old. There are wood floors and carpeting throughout the home. There are cats and two dogs in the home. There are no dust mite coverings on the bedding. There is no tobacco exposure in the home. There are no exposures to fumes or chemicals or dust.    Review of Systems  Constitutional: Negative.  Negative for chills, fever, malaise/fatigue and weight loss.  HENT: Negative.  Negative for congestion, ear discharge, ear pain and sore throat.   Eyes:  Negative for pain, discharge and redness.  Respiratory:  Negative for cough, sputum production, shortness of breath and wheezing.  Cardiovascular: Negative.  Negative for chest pain and palpitations.  Gastrointestinal:  Negative for abdominal pain, constipation, diarrhea, heartburn, nausea and vomiting.  Skin: Negative.  Negative for itching and rash.  Neurological:  Negative for dizziness and headaches.  Endo/Heme/Allergies:  Negative for environmental allergies. Does not bruise/bleed easily.      Objective:   Blood pressure 126/80, pulse 91, temperature (!) 97.2 F (36.2 C), resp. rate 16, height 5\' 4"  (1.626 m), weight 225 lb 3.2 oz (102.2 kg), SpO2 96 %. Body mass index is 38.66 kg/m.   Physical Exam:   Physical Exam Vitals reviewed.  Constitutional:      Appearance: She is well-developed.     Comments: Very pleasant and talkative.   HENT:     Head: Normocephalic and atraumatic.     Right Ear: Tympanic membrane, ear canal and external ear normal. No drainage, swelling or tenderness. Tympanic membrane is not injected, scarred, erythematous, retracted or bulging.     Left Ear: Tympanic membrane, ear canal and external ear normal. No drainage, swelling or tenderness. Tympanic membrane is not injected, scarred, erythematous, retracted or bulging.     Nose: No nasal deformity, septal deviation, mucosal edema or rhinorrhea.     Right Turbinates: Enlarged, swollen and pale.     Left Turbinates: Enlarged, swollen and pale.      Right Sinus: No maxillary sinus tenderness or frontal sinus tenderness.     Left Sinus: No maxillary sinus tenderness or frontal sinus tenderness.     Mouth/Throat:     Mouth: Mucous membranes are not pale and not dry.     Pharynx: Uvula midline.  Eyes:     General:        Right eye: No discharge.        Left eye: No discharge.     Conjunctiva/sclera: Conjunctivae normal.     Right eye: Right conjunctiva is not injected. No chemosis.    Left eye: Left conjunctiva is not injected. No chemosis.    Pupils: Pupils are equal, round, and reactive to light.  Cardiovascular:     Rate and Rhythm: Normal rate and regular rhythm.     Heart sounds: Normal heart sounds.  Pulmonary:     Effort: Pulmonary effort is normal. No tachypnea, accessory muscle usage or respiratory distress.     Breath sounds: Normal breath sounds. No wheezing, rhonchi or rales.     Comments: Moving air well in all lung fields. No increased work of breathing noted.  Chest:     Chest wall: No tenderness.  Abdominal:     Tenderness: There is no abdominal tenderness. There is no guarding or rebound.  Lymphadenopathy:     Head:     Right side of head: No submandibular, tonsillar or occipital adenopathy.     Left side of head: No submandibular, tonsillar or occipital adenopathy.     Cervical: No cervical adenopathy.  Skin:    General: Skin is warm.     Capillary Refill: Capillary refill takes less than 2 seconds.     Coloration: Skin is not pale.     Findings: No abrasion, erythema, petechiae or rash. Rash is not papular, urticarial or vesicular.  Neurological:     Mental Status: She is alert.  Psychiatric:        Behavior: Behavior is cooperative.     Diagnostic studies:   Allergy Studies:     Food Adult Perc - 10/11/20 1000     Time Antigen Placed 1041  Allergen Manufacturer Greer    Location Arm    Number of allergen test 9     Control-buffer 50% Glycerol Negative    Control-Histamine 1 mg/ml 2+    1.  Peanut Negative    10. Cashew Negative    11. Pecan Food Negative    12. Walnut Food Negative    13. Almond Negative    14. Hazelnut Negative    15. [place] nut Negative    16. Coconut Negative    17. Pistachio Negative             Allergy testing results were read and interpreted by myself, documented by clinical staff.         Malachi Bonds, MD [facility]

## 2020-10-16 LAB — COLOGUARD: COLOGUARD: NEGATIVE

## 2020-11-17 IMAGING — US US EXTREM LOW VENOUS*L*
1 series · 13 of 24 positions shown · non-contrast
Comparison: None.

CLINICAL DATA: Left knee pain for 3 weeks



[Series 1: us extrem low venous*left* · 13 of 39 slices shown]
[im 1/39]
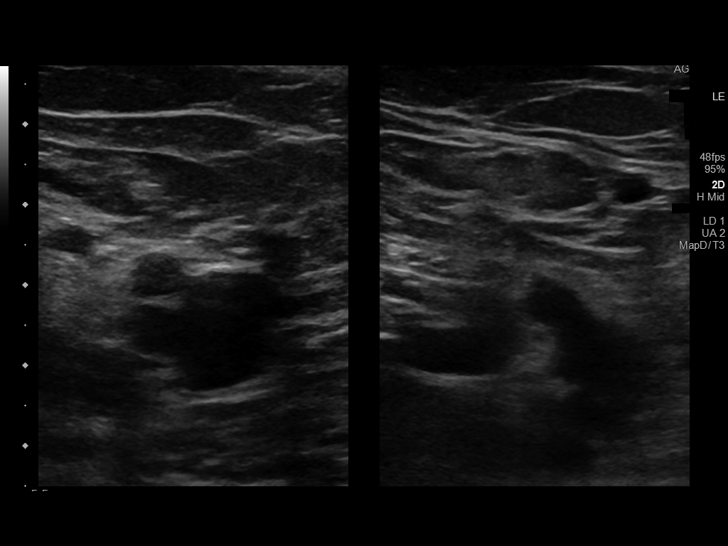
[im 4/39]
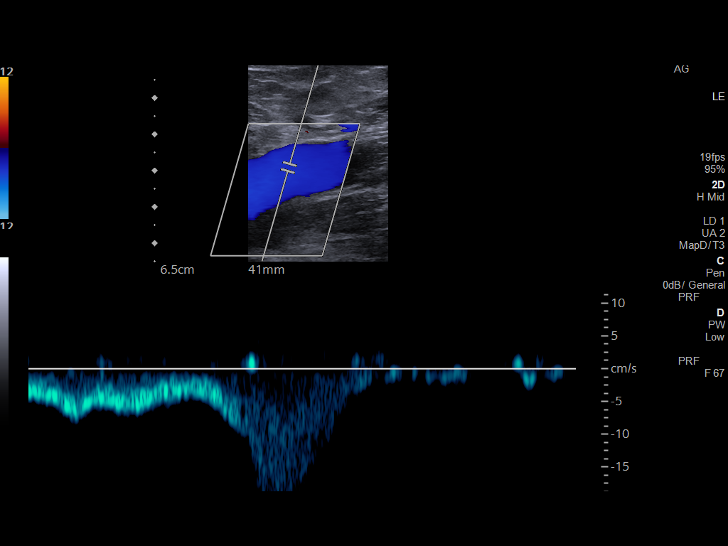
[im 7/39]
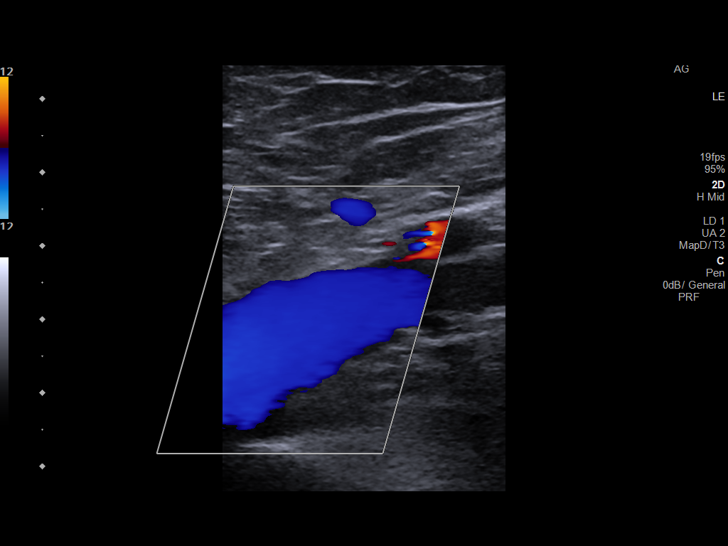
[im 10/39]
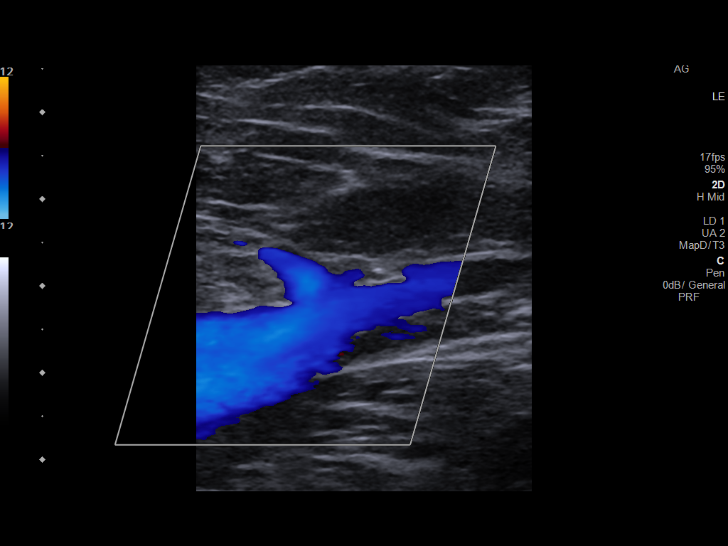
[im 14/39]
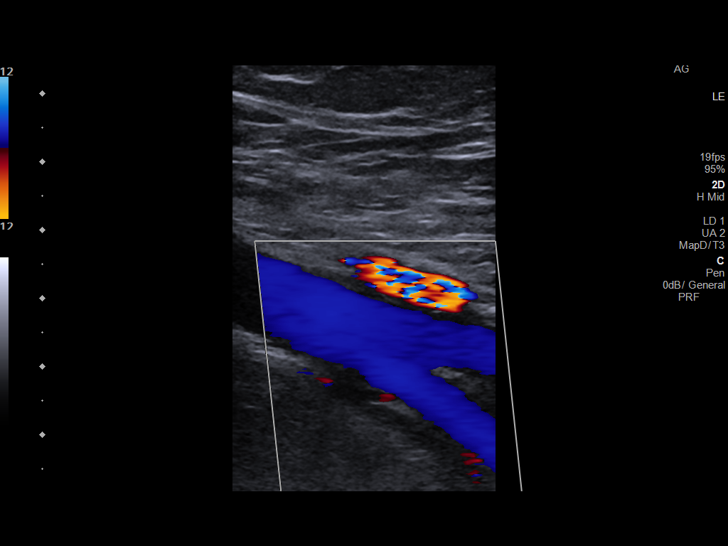
[im 17/39]
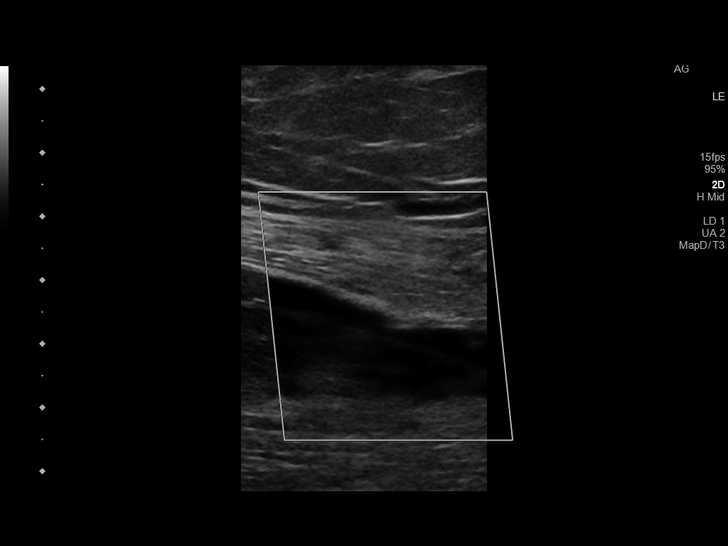
[im 20/39]
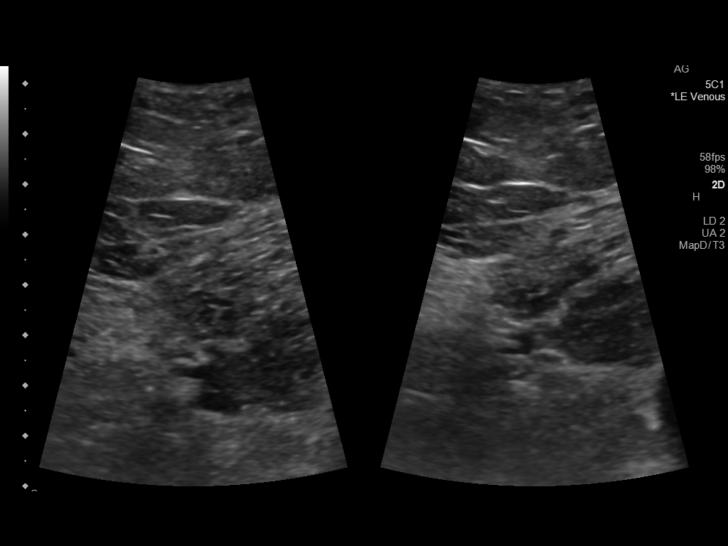
[im 22/39]
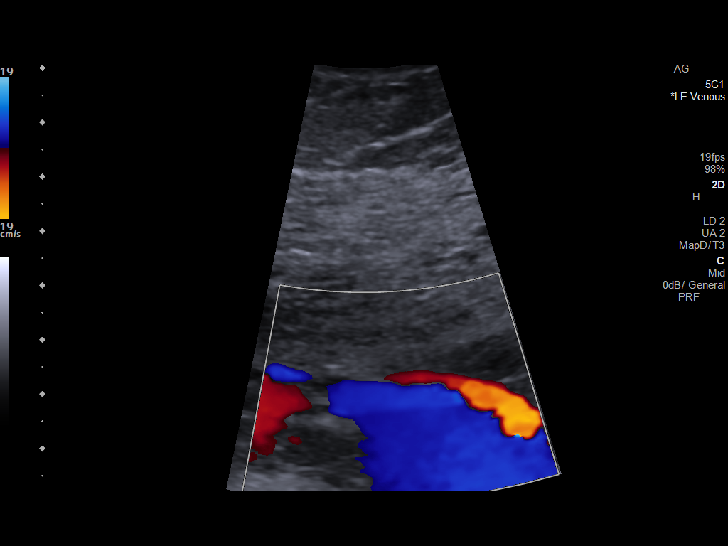
[im 25/39]
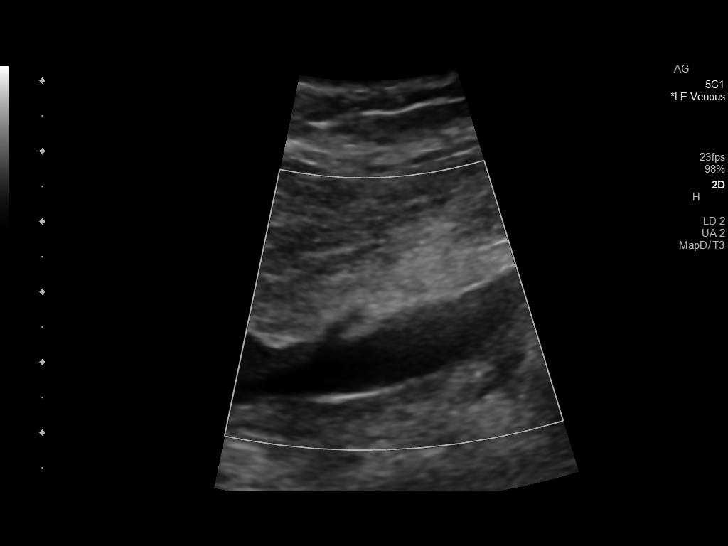
[im 29/39]
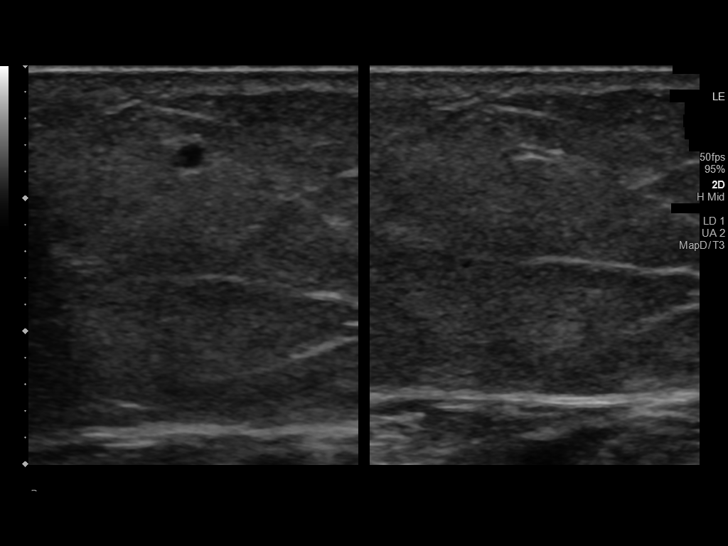
[im 32/39]
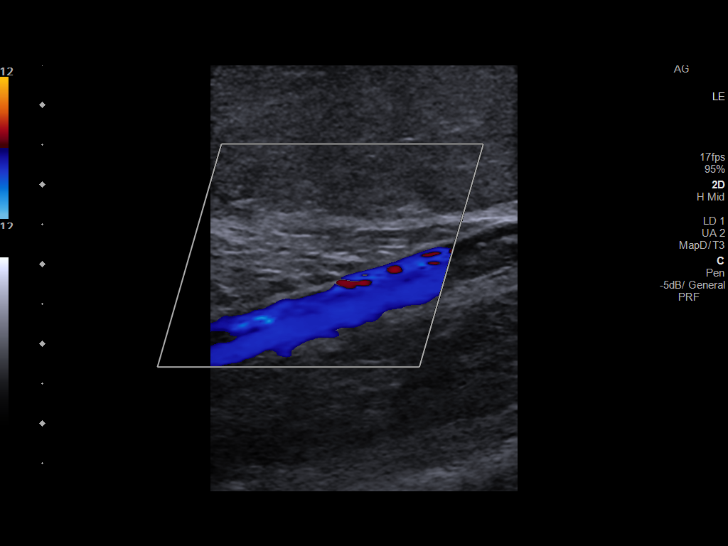
[im 35/39]
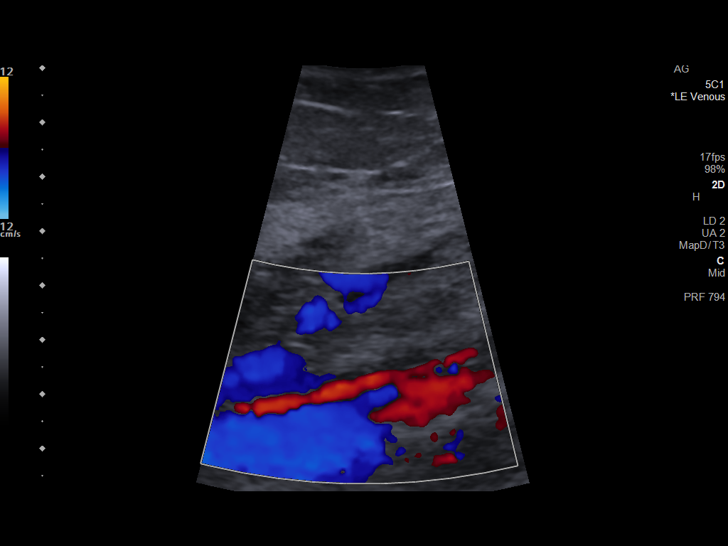
[im 39/39]
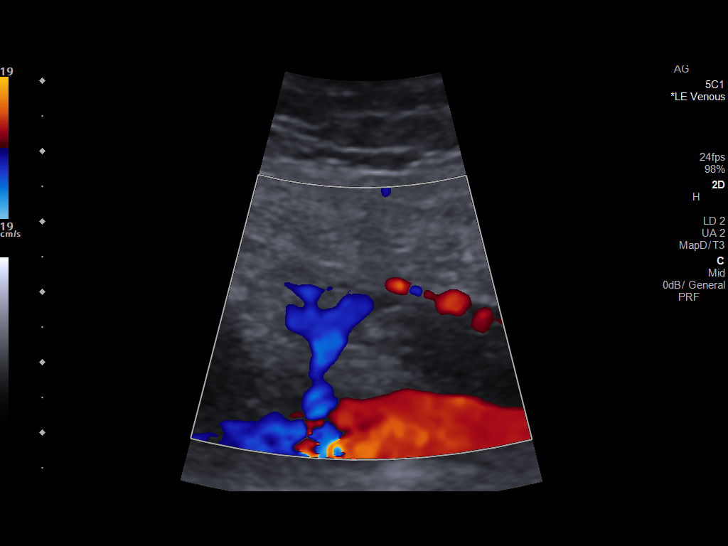

[13 of 24 positions shown; findings below may reference images not displayed]

FINDINGS: Contralateral Common Femoral Vein: Respiratory phasicity is normal
and symmetric with the symptomatic side. No evidence of thrombus.
Normal compressibility.

Common Femoral Vein: No evidence of thrombus. Normal
compressibility, respiratory phasicity and response to augmentation.

Saphenofemoral Junction: No evidence of thrombus. Normal
compressibility and flow on color Doppler imaging.

Profunda Femoral Vein: No evidence of thrombus. Normal
compressibility and flow on color Doppler imaging.

Femoral Vein: No evidence of thrombus. Normal compressibility,
respiratory phasicity and response to augmentation.

Popliteal Vein: No evidence of thrombus. Normal compressibility,
respiratory phasicity and response to augmentation.

Calf Veins: No evidence of thrombus. Normal compressibility and flow
on color Doppler imaging.

Superficial Great Saphenous Vein: No evidence of thrombus. Normal
compressibility.

Venous Reflux:  None.

Other Findings:  None.
IMPRESSION: No evidence of deep venous thrombosis.

## 2021-10-16 IMAGING — CT CT HEART SCORING
2 series · 16 of 20 positions shown, 18 images · non-contrast
Comparison: None.
COMPARISON: None.

Addendum:
EXAM:
OVER-READ INTERPRETATION CT CHEST

The following report is an over-read performed by radiologist Dr.
over-read does not include interpretation of cardiac or coronary
anatomy or pathology. The coronary calcium score/coronary CTA
interpretation by the cardiologist is attached.
CLINICAL DATA: Risk stratification
Coronary Calcium Score
TECHNIQUE: The patient was scanned on a Siemens Force scanner. Axial
non-contrast 3 mm slices were carried out through the heart. The
data set was analyzed on a dedicated work station and scored using
the Agatson method.

[Series 2: casc 3.0 i36f 2 bestdiast 65 % · axial · 0.34mm/px · z∈[-258,-162]mm · 8 of 42 slices shown, 10 images]
[im 5/42  vessel]
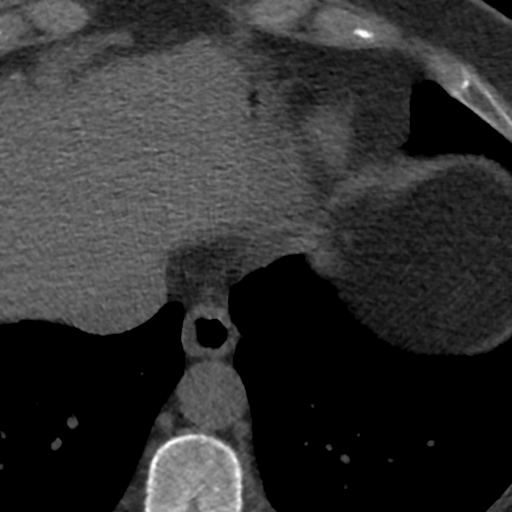
[im 5/42  lung]
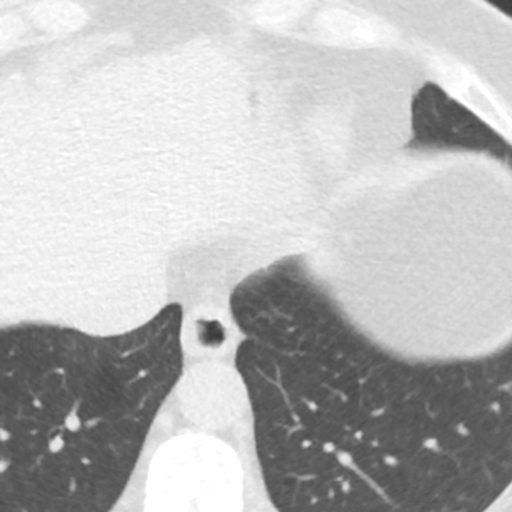
[im 10/42  vessel]
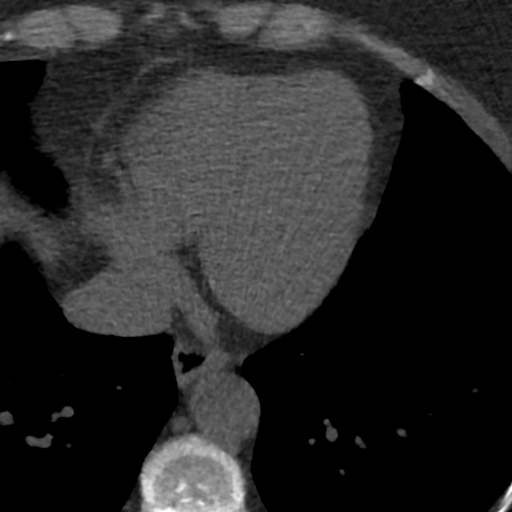
[im 14/42  vessel]
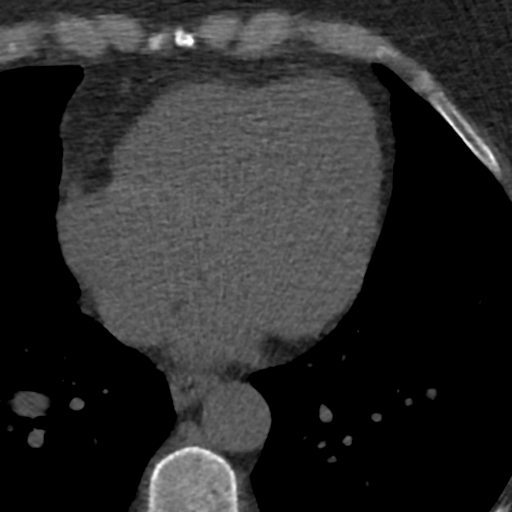
[im 19/42  vessel]
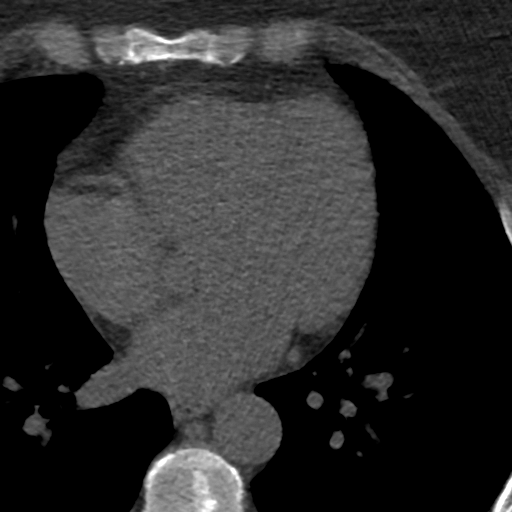
[im 23/42  vessel]
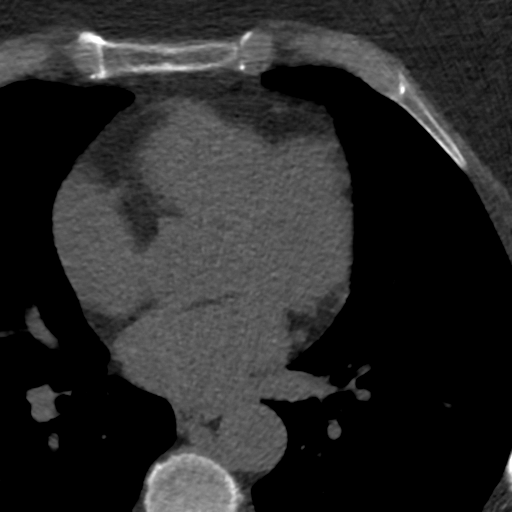
[im 23/42  lung]
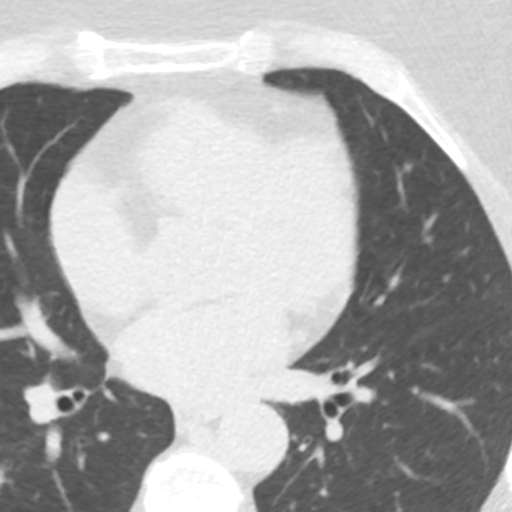
[im 28/42  vessel]
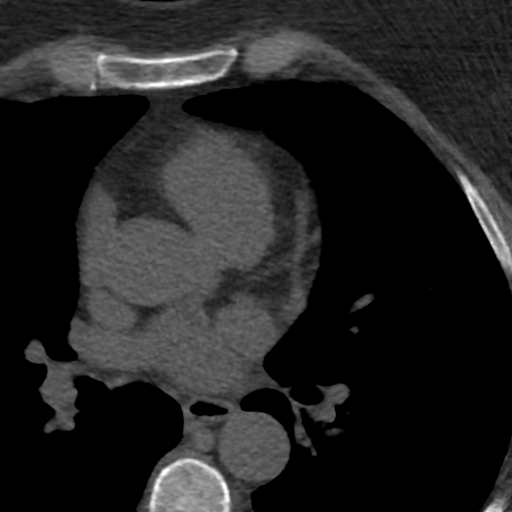
[im 32/42  vessel]
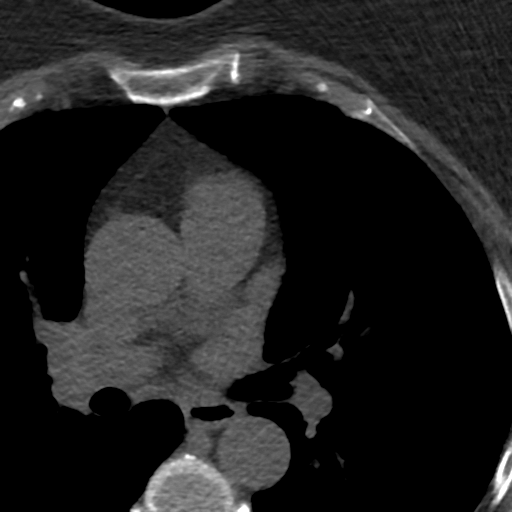
[im 37/42  vessel]
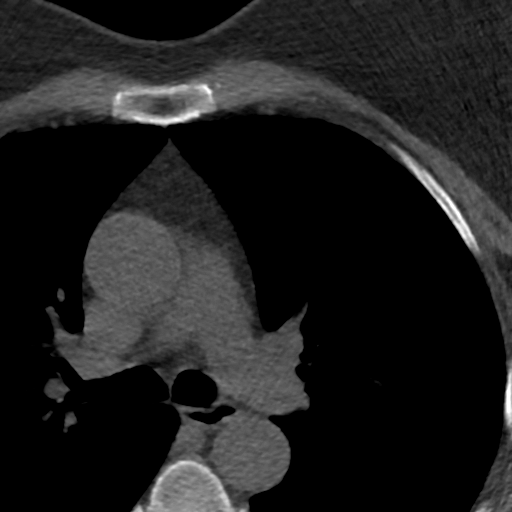

[Series 4: lung st 67 % · axial · 0.68mm/px · z∈[-261,-162]mm · 8 of 43 slices shown]
[im 5/43  lung]
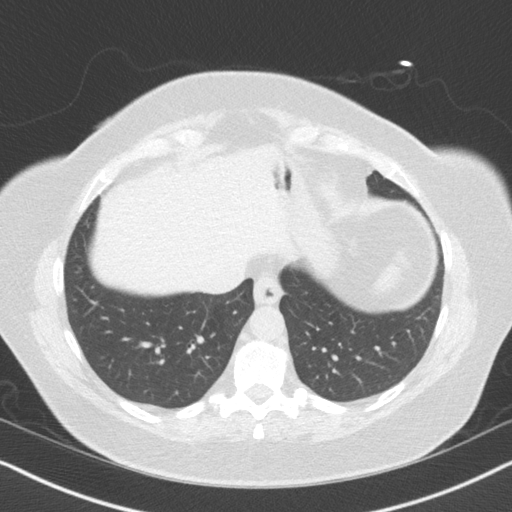
[im 10/43  lung]
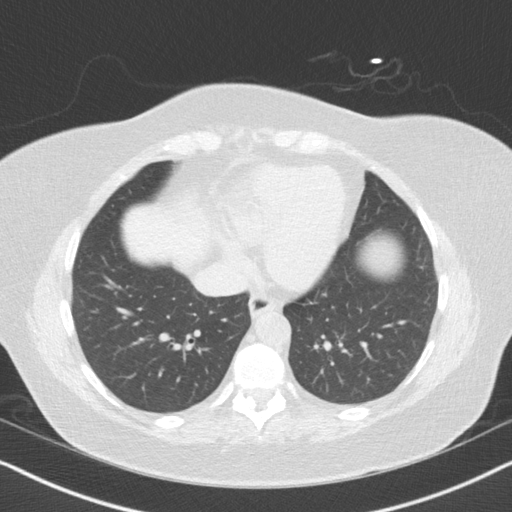
[im 15/43  lung]
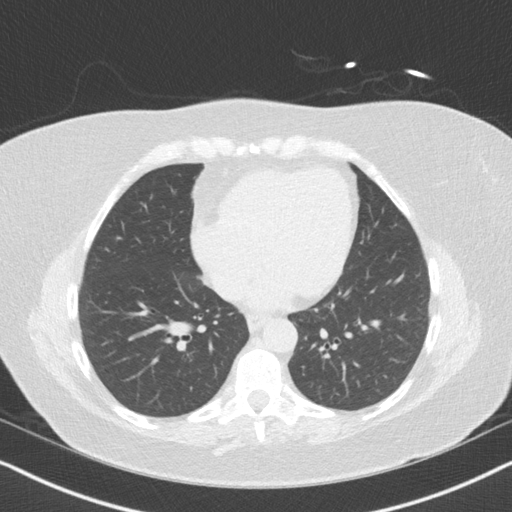
[im 19/43  lung]
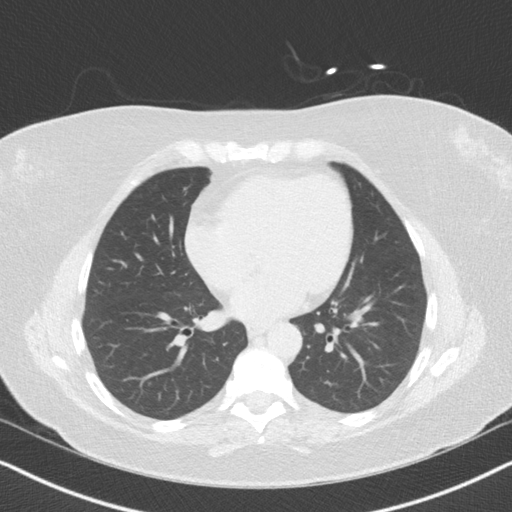
[im 24/43  lung]
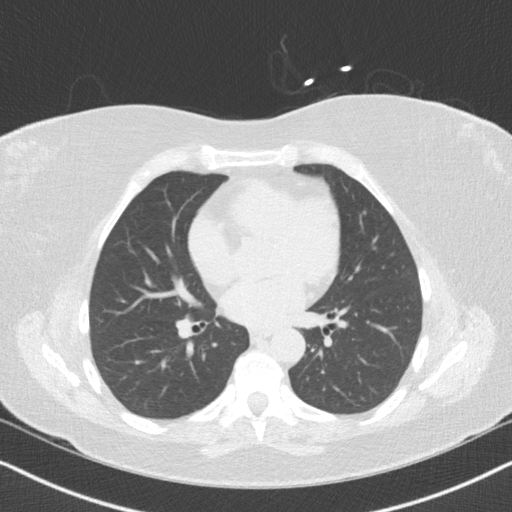
[im 29/43  lung]
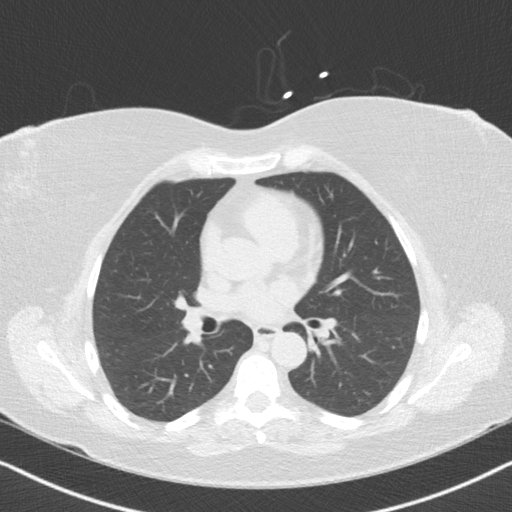
[im 33/43  lung]
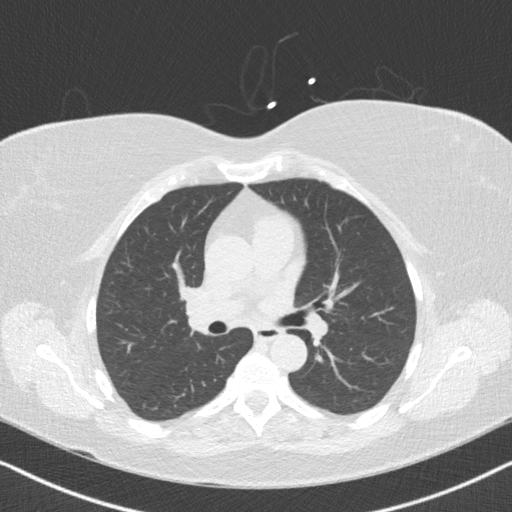
[im 38/43  lung]
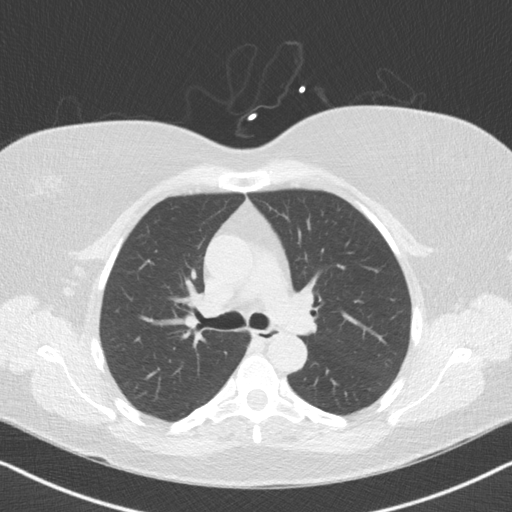

[16 of 20 positions shown; findings below may reference images not displayed]

FINDINGS: Vascular: No visible atherosclerosis and no evidence of aneurysm
involving the visualized aorta.

Mediastinum/Nodes: No pathologic lymphadenopathy within the
visualized mediastinum. Visualized esophagus normal in appearance.

Lungs/Pleura: Visualized lung parenchyma clear. Central bronchi
patent without significant bronchial wall thickening. No pleural
effusions.

Upper Abdomen: Benign approximate 1.2 cm cyst involving the MEDIAL
segment LEFT lobe of liver. Visualized upper abdomen unremarkable
for the unenhanced technique.

Musculoskeletal: Visualized bony thorax unremarkable.
IMPRESSION: 1. Benign approximate 1.2 cm cyst involving the MEDIAL segment LEFT
lobe of liver.
2. No significant extracardiac findings.
FINDINGS: Non-cardiac: See separate report from [REDACTED].

Ascending Aorta: Normal caliber.

Pericardium: Normal.

Coronary arteries: Normal origins.
IMPRESSION: Coronary calcium score of 0.

*** End of Addendum ***
EXAM:
OVER-READ INTERPRETATION CT CHEST

The following report is an over-read performed by radiologist Dr.
over-read does not include interpretation of cardiac or coronary
anatomy or pathology. The coronary calcium score/coronary CTA
interpretation by the cardiologist is attached.
FINDINGS: Vascular: No visible atherosclerosis and no evidence of aneurysm
involving the visualized aorta.

Mediastinum/Nodes: No pathologic lymphadenopathy within the
visualized mediastinum. Visualized esophagus normal in appearance.

Lungs/Pleura: Visualized lung parenchyma clear. Central bronchi
patent without significant bronchial wall thickening. No pleural
effusions.

Upper Abdomen: Benign approximate 1.2 cm cyst involving the MEDIAL
segment LEFT lobe of liver. Visualized upper abdomen unremarkable
for the unenhanced technique.

Musculoskeletal: Visualized bony thorax unremarkable.
IMPRESSION: 1. Benign approximate 1.2 cm cyst involving the MEDIAL segment LEFT
lobe of liver.
2. No significant extracardiac findings.

## 2021-11-21 ENCOUNTER — Ambulatory Visit: Payer: Medicare Other | Admitting: Bariatrics

## 2022-04-29 ENCOUNTER — Other Ambulatory Visit: Payer: Self-pay | Admitting: Obstetrics and Gynecology

## 2022-04-29 DIAGNOSIS — R109 Unspecified abdominal pain: Secondary | ICD-10-CM

## 2022-05-07 ENCOUNTER — Ambulatory Visit
Admission: RE | Admit: 2022-05-07 | Discharge: 2022-05-07 | Disposition: A | Discharge status: Home / Self Care | Payer: Medicare Other | Source: Ambulatory Visit | Attending: Obstetrics and Gynecology | Admitting: Obstetrics and Gynecology

## 2022-05-07 DIAGNOSIS — R109 Unspecified abdominal pain: Secondary | ICD-10-CM

## 2022-05-07 MED ORDER — IOPAMIDOL (ISOVUE-300) INJECTION 61%
100.0000 mL | Freq: Once | INTRAVENOUS | Status: AC | PRN
  Administered 2022-05-07: 100 mL via INTRAVENOUS

## 2022-05-24 ENCOUNTER — Ambulatory Visit (INDEPENDENT_AMBULATORY_CARE_PROVIDER_SITE_OTHER): Payer: Medicare Other | Admitting: Allergy & Immunology

## 2022-05-24 ENCOUNTER — Other Ambulatory Visit: Payer: Self-pay

## 2022-05-24 ENCOUNTER — Encounter: Payer: Self-pay | Admitting: Allergy & Immunology

## 2022-05-24 VITALS — BP 122/76 | HR 103 | Temp 97.6°F | Resp 18 | Ht 63.78 in | Wt 227.1 lb

## 2022-05-24 DIAGNOSIS — T7800XD Anaphylactic reaction due to unspecified food, subsequent encounter: Secondary | ICD-10-CM

## 2022-05-24 DIAGNOSIS — J310 Chronic rhinitis: Secondary | ICD-10-CM | POA: Diagnosis not present

## 2022-05-24 MED ORDER — EPINEPHRINE 0.3 MG/0.3ML IJ SOAJ: 0.3000 mg | INTRAMUSCULAR | 2 refills | Status: DC | PRN

## 2022-05-24 MED ORDER — HYDROXYZINE HCL 10 MG/5ML PO SYRP: 50.0000 mg | ORAL_SOLUTION | Freq: Three times a day (TID) | ORAL | 5 refills | Status: DC | PRN

## 2022-05-24 NOTE — Patient Instructions (Addendum)
1. Anaphylactic shock due to food - Get the labs so we can know for sure about your food allergies. - We can do a peanut butter challenge.    2. Chronic rhinitis - We will get some lab work to look for environmental allergies. - Let's do a full dose antihistamine daily to see if this can help during the day: Allegra 2mL (can do max of 6 mL for a full dose, so increase as needed) - Let's do a dose of hydroxyzine (start with half a tablet and go from there): hydroxyzine 5 mL at night (can do max of 25 mL at night, so increase as needed)   3. Return in about 8 weeks (around 07/19/2022). You can have the follow up appointment with Dr. Dellis Anes or a Nurse Practicioner (our Nurse Practitioners are excellent and always have Physician oversight!).    Please inform us of any Emergency Department visits, hospitalizations, or changes in symptoms. Call us before going to the ED for breathing or allergy symptoms since we might be able to fit you in for a sick visit. Feel free to contact us anytime with any questions, problems, or concerns.  It was a pleasure to see you again today!  Websites that have reliable patient information: 1. American Academy of Asthma, Allergy, and Immunology: www.aaaai.org 2. Food Allergy Research and Education (FARE): foodallergy.org 3. Mothers of Asthmatics: http://www.asthmacommunitynetwork.org 4. American College of Allergy, Asthma, and Immunology: www.acaai.org   COVID-19 Vaccine Information can be found at: [email or web address] For questions related to vaccine distribution or appointments, please email [email or web address] or call [phone].   We realize that you might be concerned about having an allergic reaction to the COVID19 vaccines. To help with that concern, WE ARE OFFERING THE COVID19 VACCINES IN OUR OFFICE! Ask the front desk for dates!     "Like" [place] on Facebook and Instagram for our latest  updates!      A healthy democracy works best when [employer] participate! Make sure you are registered to vote! If you have moved or changed any of your contact information, you will need to get this updated before voting!  In some cases, you MAY be able to register to vote online: [email or web address]

## 2022-05-24 NOTE — Progress Notes (Signed)
FOLLOW UP  Date of Service/Encounter:  05/24/22   Assessment:   Anaphylactic shock due to food (peanuts, tree nuts) - getting labs  Chronic rhinitis - seems to be spring centered (getting labs)   Preference for leopard prints  Plan/Recommendations:   1. Anaphylactic shock due to food - Get the labs so we can know for sure about your food allergies. - We can do a peanut butter challenge.    2. Chronic rhinitis - We will get some lab work to look for environmental allergies. - Let's do a full dose antihistamine daily to see if this can help during the day: Allegra 47mL (can do max of 6 mL for a full dose, so increase as needed) - Allergra crosses the blood-brain barrier at the least, so I think this is the most likely to be well-tolerated. - Let's do a dose of hydroxyzine (start with half a tablet and go from there): hydroxyzine 5 mL at night (can do max of 25 mL at night, so increase as needed)  - Hydroxyzine can also be used for anxiety, so I think this might be helpful for you.  3. Return in about 8 weeks (around 07/19/2022). You can have the follow up appointment with Dr. Dellis Anes or a Nurse Practicioner (our Nurse Practitioners are excellent and always have Physician oversight!).   Subjective:   Kaitlin Meyers is a 67 y.o. female presenting today for follow up of  Chief Complaint  Patient presents with   Allergic Rhinitis     Allergy meds trigger her anxiety. Wants to know what she can try.     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  Kaitlin Meyers is a 67 y.o. female presenting for a follow up visit.  She was last seen by me in August 2022.  At that time, she presented for a reaction to peanuts.  We did skin testing that was negative to peanuts and tree nuts.  We recommend doing a peanut butter challenge in 2 to 4 weeks.  We obtained some lab work to confirm this.  She never did get the lab work.  Since the last visit, she has been fairly stable.   Allergic Rhinitis Symptom History: She has been doing children's forms of antihistamines. She largely does not like to take anything but she feels that she can monitor and control the amount of medications that she is taking by using children's versions (liquids). She reports that she is having a lot of issues with itching.  Her allergic rhinitis has been getting worse over the last few years. She deals with this every spring for the most part. Fall does not bother her at all. She did order Nasalcrom to see if this helps.  Actually had nothing mentioned about environmental allergies at the last visit.  In fact, I wrote that she does not have any environmental allergy symptoms at all.  In retrospect, she thinks that she was not having any reaction due to the time of the year, so she just did not even think about  sharing her allergic rhinitis symptoms.  In addition, her symptoms have just progressively gotten worse over the course of the last couple years.  She prefers the liquids because she feels that the pills get stuck in her throat, although she does pills little other medications without a problem.  Food Allergy Symptom History: She continues to avoid peanuts and tree nuts. She never did get the labs from the last visit. She completely forgot.  She has not been eating peanuts or tree nuts.  She is pretty nervous about doing a challenge, which is one of the reason she never followed up before.  She is retired but worked for [employer] for years. 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 She worked out of [employer].  Her daughter still lives in [place] and she has 2 grandchildren down there, so she does still travel to [place].  However, she has lived in [place] for 15 years and has grown to love it.  She has 3 grandchildren that live in [place] who are at her house pretty consistently.  All of her grandchildren are aged 36-10.  Otherwise, there have been no changes to her past medical history, surgical history, family history, or social history.    Review of Systems  Constitutional: Negative.  Negative for chills, fever, malaise/fatigue and weight loss.  HENT: Negative.  Negative for congestion, ear discharge, ear pain and sore throat.   Eyes:  Negative for pain, discharge and redness.  Respiratory:  Negative for cough, sputum production, shortness of breath and wheezing.   Cardiovascular: Negative.  Negative for chest pain and palpitations.  Gastrointestinal:  Negative for abdominal pain, constipation, diarrhea, heartburn, nausea and vomiting.  Skin: Negative.  Negative for itching and rash.  Neurological:  Negative for dizziness and headaches.  Endo/Heme/Allergies:  Negative for environmental allergies. Does not bruise/bleed easily.       Objective:   Blood pressure 122/76, pulse (!) 103, temperature 97.6 F (36.4 C), resp. rate 18, height 5' 3.78" (1.62 m), weight 227 lb 2 oz (103 kg), SpO2 96 %. Body mass index is 39.26 kg/m.    Physical Exam Vitals reviewed.  Constitutional:      Appearance: She is well-developed.     Comments: Very pleasant and talkative.  Lovely.  HENT:     Head: Normocephalic and atraumatic.     Right Ear: Tympanic membrane, ear canal and external ear normal. No drainage, swelling or tenderness. Tympanic membrane is not injected, scarred, erythematous, retracted or bulging.     Left Ear: Tympanic membrane, ear canal and external ear normal. No drainage, swelling or tenderness. Tympanic membrane is not  injected, scarred, erythematous, retracted or bulging.     Nose: No nasal deformity, septal deviation, mucosal edema or rhinorrhea.     Right Turbinates: Enlarged, swollen and pale.     Left Turbinates: Enlarged, swollen and pale.     Right Sinus: No maxillary sinus tenderness or frontal sinus tenderness.     Left Sinus: No maxillary sinus tenderness or frontal sinus tenderness.     Comments: No nasal polyps.    Mouth/Throat:     Mouth: Mucous membranes are not pale and not dry.     Pharynx: Uvula midline.     Comments: Some cobblestoning. Eyes:     General: Lids are normal. Allergic shiner present.        Right eye: No discharge.        Left eye: No discharge.  Conjunctiva/sclera: Conjunctivae normal.     Right eye: Right conjunctiva is not injected. No chemosis.    Left eye: Left conjunctiva is not injected. No chemosis.    Pupils: Pupils are equal, round, and reactive to light.  Cardiovascular:     Rate and Rhythm: Normal rate and regular rhythm.     Heart sounds: Normal heart sounds.  Pulmonary:     Effort: Pulmonary effort is normal. No tachypnea, accessory muscle usage or respiratory distress.     Breath sounds: Normal breath sounds. No wheezing, rhonchi or rales.     Comments: Moving air well in all lung fields. No increased work of breathing noted.  Chest:     Chest wall: No tenderness.  Lymphadenopathy:     Head:     Right side of head: No submandibular, tonsillar or occipital adenopathy.     Left side of head: No submandibular, tonsillar or occipital adenopathy.     Cervical: No cervical adenopathy.  Skin:    General: Skin is warm.     Capillary Refill: Capillary refill takes less than 2 seconds.     Coloration: Skin is not pale.     Findings: No abrasion, erythema, petechiae or rash. Rash is not papular, urticarial or vesicular.  Neurological:     Mental Status: She is alert.  Psychiatric:        Behavior: Behavior is cooperative.      Diagnostic studies:  labs sent instead     Malachi Bonds, MD  [facility]

## 2022-06-06 LAB — ALLERGENS W/COMP RFLX AREA 2
Alternaria Alternata IgE: <0.1 kU/L
Aspergillus Fumigatus IgE: <0.1 kU/L
Bermuda Grass IgE: <0.1 kU/L
Cedar, Mountain IgE: <0.1 kU/L
Cladosporium Herbarum IgE: <0.1 kU/L
Cockroach, German IgE: <0.1 kU/L
Common Silver Birch IgE: <0.1 kU/L
Cottonwood IgE: <0.1 kU/L
D Farinae IgE: <0.1 kU/L
D Pteronyssinus IgE: <0.1 kU/L
E001-IgE Cat Dander: <0.1 kU/L
E005-IgE Dog Dander: <0.1 kU/L
Elm, American IgE: <0.1 kU/L
IgE (Immunoglobulin E), Serum: 111 IU/mL (ref 6–495)
Johnson Grass IgE: <0.1 kU/L
Maple/Box Elder IgE: <0.1 kU/L
Mouse Urine IgE: <0.1 kU/L
Oak, White IgE: <0.1 kU/L
Pecan, Hickory IgE: <0.1 kU/L
Penicillium Chrysogen IgE: <0.1 kU/L
Pigweed, Rough IgE: <0.1 kU/L
Ragweed, Short IgE: <0.1 kU/L
Sheep Sorrel IgE Qn: <0.1 kU/L
Timothy Grass IgE: <0.1 kU/L
White Mulberry IgE: <0.1 kU/L

## 2022-06-06 LAB — IGE NUT PROF. W/COMPONENT RFLX
F017-IgE Hazelnut (Filbert): <0.1 kU/L
F018-IgE Brazil Nut: <0.1 kU/L
F020-IgE Almond: <0.1 kU/L
F202-IgE Cashew Nut: <0.1 kU/L
F203-IgE Pistachio Nut: <0.1 kU/L
F256-IgE Walnut: <0.1 kU/L
Macadamia Nut, IgE: <0.1 kU/L
Peanut, IgE: <0.1 kU/L
Pecan Nut IgE: <0.1 kU/L

## 2022-06-18 ENCOUNTER — Ambulatory Visit: Payer: Medicare Other | Attending: Cardiovascular Disease | Admitting: Cardiovascular Disease

## 2022-06-18 ENCOUNTER — Encounter: Payer: Self-pay | Admitting: Cardiovascular Disease

## 2022-06-18 VITALS — BP 132/82 | HR 76 | Ht 64.0 in | Wt 227.8 lb

## 2022-06-18 DIAGNOSIS — R0789 Other chest pain: Secondary | ICD-10-CM

## 2022-06-18 DIAGNOSIS — R0609 Other forms of dyspnea: Secondary | ICD-10-CM

## 2022-06-18 NOTE — Progress Notes (Signed)
06/18/2022 Kaitlin Meyers   [birth year]  [id number]  Primary Physician Richardean Chimera, MD Primary Cardiologist: Runell Gess MD Milagros Loll, [place], [place]  HPI:  Kaitlin Meyers is a 67 y.o.   married Caucasian female mother of 2, grandmother 2 grandchildren who was initially self-referred to evaluate atypical chest pain.  I last saw her in the office 02/16/2019.  She has no cardiac risk factors. There is no family history. Her mother who is in her 9s is also a patient of mine. . She admits to having panic attacks.    Since I saw her 3-1/2 years ago she continues to do well.  She still struggles with her weight and stress.  She is not chest pain but does get dyspneic probably related to deconditioning.  She had a coronary CTA performed 03/01/2019 revealing a coronary calcium score of 0.  Recent 2D echo performed 05/01/2022 was essentially normal.   Current Meds  Medication Sig   clonazePAM (KLONOPIN) 0.5 MG tablet Take 0.5 mg by mouth.   DULoxetine HCl (CYMBALTA PO) Take by mouth.   levothyroxine (SYNTHROID, LEVOTHROID) 112 MCG tablet Take 112 mcg by mouth every morning.   loratadine (CLARITIN ALLERGY CHILDRENS) 5 MG/5ML syrup Take by mouth daily.     Allergies  Allergen Reactions   Peanut Allergen Powder-Dnfp Palpitations and Swelling   Sulfa Antibiotics Nausea Only and Swelling   Latex Hives    welps   Azithromycin Other (See Comments)    Pt states medication makes her have "hallucination"   Bupropion Other (See Comments)   Other     Pt reports not being able to take multiple pain medications. Unsure of drug names.    Peanut-Containing Drug Products     peanuts   Penicillins Hives, Swelling and Other (See Comments)   Azo Yeast Plus [Traumeel] Anxiety   Peanut Oil Palpitations    Social History   Socioeconomic History   Marital status: Married    Spouse name: Not on file   Number of children: Not on file   Years of education: Not on file   Highest education level:  Not on file  Occupational History   Not on file  Tobacco Use   Smoking status: Never   Smokeless tobacco: Never  Substance and Sexual Activity   Alcohol use: Yes    Alcohol/week: 0.0 standard drinks of alcohol   Drug use: No   Sexual activity: Yes    Birth control/protection: Post-menopausal  Other Topics Concern   Not on file  Social History Narrative   Not on file   Social Determinants of Health   Financial Resource Strain: Not on file  Food Insecurity: Not on file  Transportation Needs: Not on file  Physical Activity: Not on file  Stress: Not on file  Social Connections: Not on file  Intimate Partner Violence: Not on file     Review of Systems: General: negative for chills, fever, night sweats or weight changes.  Cardiovascular: negative for chest pain, dyspnea on exertion, edema, orthopnea, palpitations, paroxysmal nocturnal dyspnea or shortness of breath Dermatological: negative for rash Respiratory: negative for cough or wheezing Urologic: negative for hematuria Abdominal: negative for nausea, vomiting, diarrhea, bright red blood per rectum, melena, or hematemesis Neurologic: negative for visual changes, syncope, or dizziness All other systems reviewed and are otherwise negative except as noted above.    Blood pressure 132/82, pulse 76, height 5\' 4"  (1.626 m), weight 227 lb 12.8 oz (103.3 kg),  SpO2 95 %.  General appearance: alert and no distress Neck: no adenopathy, no carotid bruit, no JVD, supple, symmetrical, trachea midline, and thyroid not enlarged, symmetric, no tenderness/mass/nodules Lungs: clear to auscultation bilaterally Heart: regular rate and rhythm, S1, S2 normal, no murmur, click, rub or gallop Extremities: extremities normal, atraumatic, no cyanosis or edema Pulses: 2+ and symmetric Skin: Skin color, texture, turgor normal. No rashes or lesions Neurologic: Grossly normal  EKG sinus rhythm at 76 without ST or T wave changes.  Personally reviewed  this EKG.  ASSESSMENT AND PLAN:   Atypical chest pain History of atypical chest pain and in the past with coronary calcium score of 0 performed 03/01/2019.  Dyspnea on exertion History of dyspnea on exertion probably multifactorial from obesity and deconditioning.  She did have a 2D echo performed 05/01/2022 which was essentially normal.  Morbid obesity (HCC) BMI 39.  Patient motivated to lose weight.  Will refer to [facility] for physician facilitated weight loss.     Runell Gess MD FACP,FACC,FAHA, [facility] 06/18/2022 2:13 PM

## 2022-06-18 NOTE — Assessment & Plan Note (Signed)
History of dyspnea on exertion probably multifactorial from obesity and deconditioning.  She did have a 2D echo performed 05/01/2022 which was essentially normal.

## 2022-06-18 NOTE — Patient Instructions (Signed)
Medication Instructions:  Your physician recommends that you continue on your current medications as directed. Please refer to the Current Medication list given to you today.  *If you need a refill on your cardiac medications before your next appointment, please call your pharmacy*   Lab Work: Your physician recommends that you return for lab work in: the next week or 2 for FASTING lipid/liver panel  If you have labs (blood work) drawn today and your tests are completely normal, you will receive your results only by: MyChart Message (if you have MyChart) OR A paper copy in the mail If you have any lab test that is abnormal or we need to change your treatment, we will call you to review the results.   Follow-Up: At [facility], you and your health needs are our priority.  As part of our continuing mission to provide you with exceptional heart care, we have created designated Provider Care Teams.  These Care Teams include your primary Cardiologist (physician) and Advanced Practice Providers (APPs -  Physician Assistants and Nurse Practitioners) who all work together to provide you with the care you need, when you need it.  We recommend signing up for the patient portal called "MyChart".  Sign up information is provided on this After Visit Summary.  MyChart is used to connect with patients for Virtual Visits (Telemedicine).  Patients are able to view lab/test results, encounter notes, upcoming appointments, etc.  Non-urgent messages can be sent to your provider as well.   To learn more about what you can do with MyChart, go to [email or web address].    Your next appointment:   12 month(s)  Provider:   Jonathan Berry, MD    

## 2022-06-18 NOTE — Assessment & Plan Note (Signed)
BMI 39.  Patient motivated to lose weight.  Will refer to [facility] for physician facilitated weight loss.

## 2022-06-18 NOTE — Assessment & Plan Note (Signed)
History of atypical chest pain and in the past with coronary calcium score of 0 performed 03/01/2019.

## 2022-07-19 ENCOUNTER — Ambulatory Visit: Payer: Medicare Other | Admitting: Allergy & Immunology

## 2022-07-19 ENCOUNTER — Other Ambulatory Visit: Payer: Self-pay

## 2022-07-19 ENCOUNTER — Ambulatory Visit (INDEPENDENT_AMBULATORY_CARE_PROVIDER_SITE_OTHER): Payer: Medicare Other | Admitting: Allergy & Immunology

## 2022-07-19 ENCOUNTER — Encounter: Payer: Self-pay | Admitting: Allergy & Immunology

## 2022-07-19 VITALS — BP 160/100 | HR 87 | Temp 98.1°F | Resp 18

## 2022-07-19 DIAGNOSIS — J302 Other seasonal allergic rhinitis: Secondary | ICD-10-CM

## 2022-07-19 DIAGNOSIS — J3089 Other allergic rhinitis: Secondary | ICD-10-CM | POA: Diagnosis not present

## 2022-07-19 DIAGNOSIS — L508 Other urticaria: Secondary | ICD-10-CM | POA: Insufficient documentation

## 2022-07-19 DIAGNOSIS — L299 Pruritus, unspecified: Secondary | ICD-10-CM

## 2022-07-19 DIAGNOSIS — T7800XA Anaphylactic reaction due to unspecified food, initial encounter: Secondary | ICD-10-CM | POA: Insufficient documentation

## 2022-07-19 DIAGNOSIS — T7800XD Anaphylactic reaction due to unspecified food, subsequent encounter: Secondary | ICD-10-CM

## 2022-07-19 NOTE — Progress Notes (Signed)
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 Anaphylactic shock due to food - Introduce peanuts and tree nuts at home.  - We can do a peanut butter challenge and a tree nut challenge in the office if you are nervous.   2. Perennial and seasonal allergic rhinitis - Testing was positive to tree mix, indoor molds, and dog. - Avoidance measures provided. - Definitely get the dog out of your bedroom and wash your bedding. - Continue with Claritin in the morning and Zyrtec at night since this seems to be working.   3. Itching - symptoms consistent with physical hives - It sounds like you have hives secondary to pressure (explaining the problems with lifting heavy furniture and bra straps etc).  - The daily every day use of the Claritin and the Zyrtec should help with this. - We can be more aggressive in the future if you want.   4. Return in about 3 months (around 10/19/2022).    Subjective:   Kaitlin Meyers is a 67 y.o. female presenting today for follow up of  Chief Complaint  Patient presents with   Allergy Testing    iD's   Pruritus    Kaitlin Meyers has a history of the following: Patient Active Problem List   Diagnosis Date Noted   Morbid obesity (HCC) 06/18/2022   Adult hypothyroidism 01/21/2020   Dyspnea on exertion 02/16/2019   Atypical chest pain 03/15/2015   Acquired trigger finger 01/22/2012   Hand pain 01/22/2012   Abnormality of gait 02/08/2011   Pain in joint, lower leg 02/08/2011   Muscle weakness (generalized) 02/08/2011   Limitation of joint motion 02/08/2011    History obtained from: chart review and  patient.  Kaitlin Meyers is a 67 y.o. female presenting for skin testing.  We last saw her in April 2024.  At that time, we obtained lab work for peanuts and tree nuts due to her history of reacting to peanut butter.  Lab work was negative to the entire panel, so we offered her intradermal testing.  Since last visit, she has been about the same.  Allergic Rhinitis Symptom History: She is having a good amount of postnasal drip. This is worse in the morning. She uses a Netti pot and this helps.  She had the environmental allergy testing that was negative. She reports that after her visit, she was good for a few weeks. So she stopped her allergy medications. But then her symptoms resumed. She uses a bit of Claritin and this helps.   Food Allergy Symptom History: She did not pick up the  EpiPen.  She has not introduced peanuts or tree nuts into her diet.  She is a little bit nervous about it.  Skin Symptom History: She has a lot of itching in areas where she has pressure. She reports a lot of itching. She did hep her husband unload some heavy items. She has itching, but no hives.  Whenever she lifts heavy items, the pressure from the item on her hands for instance leads to intense itching.  Otherwise, there have been no changes to her past medical history, surgical history, family history, or social history.  Review of Systems  Constitutional: Negative.  Negative for chills, fever, malaise/fatigue and weight loss.  HENT: Negative.  Negative for congestion, ear discharge, ear pain and sore throat.   Eyes:  Negative for pain, discharge and redness.  Respiratory:  Negative for cough, sputum production, shortness of breath and wheezing.   Cardiovascular: Negative.  Negative for chest pain and palpitations.  Gastrointestinal:  Negative for abdominal pain, constipation, diarrhea, heartburn, nausea and vomiting.  Skin:  Positive for itching. Negative for rash.  Neurological:  Negative for dizziness and  headaches.  Endo/Heme/Allergies:  Positive for environmental allergies. Does not bruise/bleed easily.       Objective:   Blood pressure (!) 160/100, pulse 87, temperature 98.1 F (36.7 C), resp. rate 18, SpO2 97 %. There is no height or weight on file to calculate BMI.    Physical Exam Vitals reviewed.  Constitutional:      Appearance: She is well-developed.     Comments: Very pleasant and talkative.  Lovely.  Anxious at times.  HENT:     Head: Normocephalic and atraumatic.     Right Ear: Tympanic membrane, ear canal and external ear normal. No drainage, swelling or tenderness. Tympanic membrane is not injected, scarred, erythematous, retracted or bulging.     Left Ear: Tympanic membrane, ear canal and external ear normal. No drainage, swelling or tenderness. Tympanic membrane is not injected, scarred, erythematous, retracted or bulging.     Nose: No nasal deformity, septal deviation, mucosal edema or rhinorrhea.     Right Turbinates: Enlarged, swollen and pale.     Left Turbinates: Enlarged, swollen and pale.     Right Sinus: No maxillary sinus tenderness or frontal sinus tenderness.     Left Sinus: No maxillary sinus tenderness or frontal sinus tenderness.     Comments: No nasal polyps.    Mouth/Throat:     Mouth: Mucous membranes are not pale and not dry.     Pharynx: Uvula midline.     Comments: Some cobblestoning. Eyes:     General: Lids are normal. Allergic shiner present.        Right eye: No discharge.        Left eye: No discharge.     Conjunctiva/sclera: Conjunctivae normal.     Right eye: Right conjunctiva is not injected. No chemosis.    Left eye: Left conjunctiva is not injected. No chemosis.    Pupils: Pupils are equal, round, and reactive to light.  Cardiovascular:     Rate and Rhythm: Normal rate and regular rhythm.     Heart sounds: Normal heart sounds.  Pulmonary:     Effort: Pulmonary effort is normal. No tachypnea, accessory muscle usage or respiratory  distress.     Breath sounds: Normal breath sounds. No wheezing, rhonchi or rales.     Comments: Moving air well in all lung fields. No increased work of breathing noted.  Chest:     Chest wall: No tenderness.  Lymphadenopathy:     Head:     Right side of head: No submandibular, tonsillar or occipital adenopathy.     Left side of head: No submandibular, tonsillar or occipital adenopathy.     Cervical: No cervical adenopathy.  Skin:    General: Skin is warm.     Capillary Refill: Capillary refill takes less than 2 seconds.     Coloration: Skin is not pale.     Findings: No abrasion, erythema, petechiae or rash. Rash is not papular, urticarial or vesicular.  Comments: Very sensitive skin today.  Neurological:     Mental Status: She is alert.  Psychiatric:        Behavior: Behavior is cooperative.      Diagnostic studies:   Allergy Studies:     Intradermal - 07/19/22 1158     Time Antigen Placed 1140    Allergen Manufacturer Greer    Location Arm    Number of Test 16    Control Negative    Bahia Negative    [place] Negative    [place] Negative    7 Grass Negative    Ragweed Mix Negative    Weed Mix Negative    Tree Mix 2+    Mold 1 Negative    Mold 2 2+    Mold 3 Negative    Mold 4 Negative    Mite Mix Negative    Cat Negative    Dog 2+    Cockroach Negative             Allergy testing results were read and interpreted by myself, documented by clinical staff.      Malachi Bonds, MD  [facility]

## 2022-07-19 NOTE — Patient Instructions (Addendum)
1. Anaphylactic shock due to food - Introduce peanuts and tree nuts at home.  - We can do a peanut butter challenge and a tree nut challenge in the office if you are nervous.   2. Perennial and seasonal allergic rhinitis - Testing was positive to tree mix, indoor molds, and dog. - Avoidance measures provided. - Definitely get the dog out of your bedroom and wash your bedding. - Continue with Claritin in the morning and Zyrtec at night since this seems to be working.   3. Itching - symptoms consistent with physical hives - It sounds like you have hives secondary to pressure (explaining the problems with lifting heavy furniture and bra straps etc).  - The daily every day use of the Claritin and the Zyrtec should help with this. - We can be more aggressive in the future if you want.   4. Return in about 3 months (around 10/19/2022).   Please inform us of any Emergency Department visits, hospitalizations, or changes in symptoms. Call us before going to the ED for breathing or allergy symptoms since we might be able to fit you in for a sick visit. Feel free to contact us anytime with any questions, problems, or concerns.  It was a pleasure to see you again today!  Websites that have reliable patient information: 1. American Academy of Asthma, Allergy, and Immunology: www.aaaai.org 2. Food Allergy Research and Education (FARE): foodallergy.org 3. Mothers of Asthmatics: http://www.asthmacommunitynetwork.org 4. American College of Allergy, Asthma, and Immunology: www.acaai.org   COVID-19 Vaccine Information can be found at: [email or web address] For questions related to vaccine distribution or appointments, please email [email or web address] or call [phone].   We realize that you might be concerned about having an allergic reaction to the COVID19 vaccines. To help with that concern, WE ARE OFFERING THE COVID19 VACCINES IN OUR OFFICE!  Ask the front desk for dates!     "Like" [place] on Facebook and Instagram for our latest updates!      A healthy democracy works best when [employer] participate! Make sure you are registered to vote! If you have moved or changed any of your contact information, you will need to get this updated before voting!  In some cases, you MAY be able to register to vote online: [email or web address]      Intradermal - 07/19/22 1158     Time Antigen Placed 1140    Allergen Manufacturer Greer    Location Arm    Number of Test 16    Control Negative    Bahia Negative    [place] Negative    Johnson Negative    7 Grass Negative    Ragweed Mix Negative    Weed Mix Negative    Tree Mix 2+    Mold 1 Negative    Mold 2 2+    Mold 3 Negative    Mold 4 Negative    Mite Mix Negative    Cat Negative    Dog 2+    Cockroach Negative             Reducing Pollen Exposure  The American Academy of Allergy, Asthma and Immunology suggests the following steps to reduce your exposure to pollen during allergy seasons.    Do not hang sheets or clothing out to dry; pollen may collect on these items. Do not mow lawns or spend time around freshly cut grass; mowing stirs up pollen. Keep windows closed at night.  Keep car windows closed while driving. Minimize morning activities outdoors,  a time when pollen counts are usually at their highest. Stay indoors as much as possible when pollen counts or humidity is high and on windy days when pollen tends to remain in the air longer. Use air conditioning when possible.  Many air conditioners have filters that trap the pollen spores. Use a HEPA room air filter to remove pollen form the indoor air you breathe.  Control of Mold Allergen   Mold and fungi can grow on a variety of surfaces provided certain temperature and moisture conditions exist.  Outdoor molds grow on plants, decaying vegetation and soil.  The major outdoor mold,  Alternaria and Cladosporium, are found in very high numbers during hot and dry conditions.  Generally, a late Summer - Fall peak is seen for common outdoor fungal spores.  Rain will temporarily lower outdoor mold spore count, but counts rise rapidly when the rainy period ends.  The most important indoor molds are Aspergillus and Penicillium.  Dark, humid and poorly ventilated basements are ideal sites for mold growth.  The next most common sites of mold growth are the bathroom and the kitchen.   Indoor (Perennial) Mold Control   Positive indoor molds via skin testing: Aspergillus and Penicillium  Maintain humidity below 50%. Clean washable surfaces with 5% bleach solution. Remove sources e.g. contaminated carpets.    Control of Dog or Cat Allergen  Avoidance is the best way to manage a dog or cat allergy. If you have a dog or cat and are allergic to dog or cats, consider removing the dog or cat from the home. If you have a dog or cat but don't want to find it a new home, or if your family wants a pet even though someone in the household is allergic, here are some strategies that may help keep symptoms at bay:  Keep the pet out of your bedroom and restrict it to only a few rooms. Be advised that keeping the dog or cat in only one room will not limit the allergens to that room. Don't pet, hug or kiss the dog or cat; if you do, wash your hands with soap and water. High-efficiency particulate air (HEPA) cleaners run continuously in a bedroom or living room can reduce allergen levels over time. Regular use of a high-efficiency vacuum cleaner or a central vacuum can reduce allergen levels. Giving your dog or cat a bath at least once a week can reduce airborne allergen.

## 2022-07-22 NOTE — Addendum Note (Signed)
Addended by: Elsworth Soho on: 07/22/2022 03:49 PM   Modules accepted: Orders

## 2022-09-25 ENCOUNTER — Ambulatory Visit
Admission: EM | Admit: 2022-09-25 | Discharge: 2022-09-25 | Disposition: A | Discharge status: Home / Self Care | Payer: Medicare Other | Attending: Family Medicine | Admitting: Family Medicine

## 2022-09-25 ENCOUNTER — Encounter: Payer: Self-pay | Admitting: Emergency Medicine

## 2022-09-25 DIAGNOSIS — J0100 Acute maxillary sinusitis, unspecified: Secondary | ICD-10-CM

## 2022-09-25 MED ORDER — DOXYCYCLINE MONOHYDRATE 25 MG/5ML PO SUSR: 100.0000 mg | Freq: Two times a day (BID) | ORAL | 0 refills | Status: DC

## 2022-09-25 NOTE — ED Triage Notes (Signed)
Nasal congestion that is green in color, right ear pain that radiates down neck x 3 months.  Hx of covid last week.  Has been using a netti pot and Flonase without relief.

## 2022-09-25 NOTE — ED Provider Notes (Signed)
[facility]    CSN: [id number] Arrival date & time: 09/25/22  1800      History   Chief Complaint No chief complaint on file.   HPI Kaitlin Meyers is a 67 y.o. female.   Patient presenting today with several month history of nasal congestion, right ear pressure and pain that has worsened since having COVID last week.  Now having significant facial pain and pressure, fatigue, malaise.  Denies chest pain, shortness of breath, abdominal pain, nausea vomiting or diarrhea.  History of significant seasonal allergies on antihistamines, Flonase, and does Nettie pot several times a day.    Past Medical History:  Diagnosis Date   Arthritis    Atypical chest pain    Complication of anesthesia ~1976   difficulty awaking   Hx of migraines 01/15/11   "I've had 2 migraines w/o pain in my life; vision issues"   Hypothyroidism     Patient Active Problem List   Diagnosis Date Noted   Pruritus 07/19/2022   Anaphylactic shock due to adverse food reaction 07/19/2022   Seasonal and perennial allergic rhinitis 07/19/2022   Physical urticaria 07/19/2022   Morbid obesity (HCC) 06/18/2022   Adult hypothyroidism 01/21/2020   Dyspnea on exertion 02/16/2019   Atypical chest pain 03/15/2015   Acquired trigger finger 01/22/2012   Hand pain 01/22/2012   Abnormality of gait 02/08/2011   Pain in joint, lower leg 02/08/2011   Muscle weakness (generalized) 02/08/2011   Limitation of joint motion 02/08/2011    Past Surgical History:  Procedure Laterality Date   BLADDER SURGERY  2011   "mesh"   CERVICAL DISC SURGERY  2007   KNEE ARTHROSCOPY  01/2010   right   PARTIAL KNEE ARTHROPLASTY  01/14/2011   Procedure: UNICOMPARTMENTAL KNEE;  Surgeon: Raymon Mutton, MD;  Location: [facility] OR;  Service: Orthopedics;  Laterality: Right;   REPLACEMENT UNICONDYLAR JOINT KNEE  01/14/11   "partial right medial replacement"   TEMPOROMANDIBULAR JOINT SURGERY  1987   VAGINAL HYSTERECTOMY  2011    OB  History   No obstetric history on file.      Home Medications    Prior to Admission medications   Medication Sig Start Date End Date Taking? Authorizing Provider  doxycycline (VIBRAMYCIN) 25 MG/5ML SUSR Take 20 mLs (100 mg total) by mouth 2 (two) times daily for 7 days. 09/25/22 10/02/22 Yes Particia Nearing, PA-C  Cetirizine HCl (ZYRTEC PO) Take by mouth.    [provider]  clonazePAM (KLONOPIN) 0.5 MG tablet Take 0.5 mg by mouth.    [provider]  DULoxetine HCl (CYMBALTA PO) Take by mouth.    [provider]  EPINEPHrine (EPIPEN) 0.3 mg/0.3 mL IJ SOAJ injection Inject 0.3 mg into the muscle as needed (as needed for severe allergic reaction). Patient not taking: Reported on 06/18/2022 05/24/22   Alfonse Spruce, MD  levothyroxine (SYNTHROID, LEVOTHROID) 112 MCG tablet Take 112 mcg by mouth every morning. 07/12/14   [provider]  loratadine (CLARITIN ALLERGY CHILDRENS) 5 MG/5ML syrup Take by mouth daily.    [provider]    Family History Family History  Problem Relation Age of Onset   Arthritis Other    Diabetes Other    Stroke Paternal Grandmother    Diabetes Paternal Grandmother     Social History Social History   Tobacco Use   Smoking status: Never   Smokeless tobacco: Never  Substance Use Topics   Alcohol use: Yes  Alcohol/week: 0.0 standard drinks of alcohol   Drug use: No     Allergies   Peanut allergen powder-dnfp, Sulfa antibiotics, Latex, Azithromycin, Bupropion, Other, Peanut-containing drug products, Penicillins, Azo yeast plus [traumeel], and Peanut oil   Review of Systems Review of Systems Per HPI  Physical Exam Triage Vital Signs ED Triage Vitals  Encounter Vitals Group     BP 09/25/22 1831 (!) 148/87     Systolic BP Percentile --      Diastolic BP Percentile --      Pulse Rate 09/25/22 1831 81     Resp 09/25/22 1831 18     Temp 09/25/22 1831 98 F (36.7 C)     Temp Source  09/25/22 1831 Oral     SpO2 09/25/22 1831 98 %     Weight --      Height --      Head Circumference --      Peak Flow --      Pain Score 09/25/22 1832 0     Pain Loc --      Pain Education --      Exclude from Growth Chart --    No data found.  Updated Vital Signs BP (!) 148/87 (BP Location: Right Arm)   Pulse 81   Temp 98 F (36.7 C) (Oral)   Resp 18   SpO2 98%   Visual Acuity Right Eye Distance:   Left Eye Distance:   Bilateral Distance:    Right Eye Near:   Left Eye Near:    Bilateral Near:     Physical Exam Vitals and nursing note reviewed.  Constitutional:      Appearance: Normal appearance.  HENT:     Head: Atraumatic.     Right Ear: Tympanic membrane and external ear normal.     Left Ear: Tympanic membrane and external ear normal.     Nose: Congestion present.     Mouth/Throat:     Mouth: Mucous membranes are moist.     Pharynx: Posterior oropharyngeal erythema present.  Eyes:     Extraocular Movements: Extraocular movements intact.     Conjunctiva/sclera: Conjunctivae normal.  Cardiovascular:     Rate and Rhythm: Normal rate and regular rhythm.     Heart sounds: Normal heart sounds.  Pulmonary:     Effort: Pulmonary effort is normal.     Breath sounds: Normal breath sounds. No wheezing.  Musculoskeletal:        General: Normal range of motion.     Cervical back: Normal range of motion and neck supple.  Skin:    General: Skin is warm and dry.  Neurological:     Mental Status: She is alert and oriented to person, place, and time.  Psychiatric:        Mood and Affect: Mood normal.        Thought Content: Thought content normal.      UC Treatments / Results  Labs (all labs ordered are listed, but only abnormal results are displayed) Labs Reviewed - No data to display  EKG   Radiology No results found.  Procedures Procedures (including critical care time)  Medications Ordered in UC Medications - No data to display  Initial  Impression / Assessment and Plan / UC Course  I have reviewed the triage vital signs and the nursing notes.  Pertinent labs & imaging results that were available during my care of the patient were reviewed by me and considered in my medical decision making (  see chart for details).     Given duration worsening course, treat with doxycycline, continued nasal spray, Nettie pot, supportive home care.  Return for worsening symptoms.  Patient requested liquid medication today.  Final Clinical Impressions(s) / UC Diagnoses   Final diagnoses:  Acute non-recurrent maxillary sinusitis   Discharge Instructions   None    ED Prescriptions     Medication Sig Dispense Auth. Provider   doxycycline (VIBRAMYCIN) 25 MG/5ML SUSR Take 20 mLs (100 mg total) by mouth 2 (two) times daily for 7 days. 280 mL Particia Nearing, [place]      PDMP not reviewed this encounter.   Particia Nearing, [place] 09/25/22 1940

## 2022-09-26 ENCOUNTER — Telehealth: Payer: Self-pay | Admitting: Emergency Medicine

## 2022-09-26 MED ORDER — DOXYCYCLINE MONOHYDRATE 25 MG/5ML PO SUSR: 100.0000 mg | Freq: Two times a day (BID) | ORAL | 0 refills | Status: AC

## 2022-09-26 NOTE — Telephone Encounter (Signed)
Pt called and reported pharmacy did not receive prescription. Attempted to call pharmacy x2, no answer. Re-sent in prescription and discussed with pt.

## 2022-12-30 ENCOUNTER — Other Ambulatory Visit: Payer: Self-pay | Admitting: *Deleted

## 2022-12-30 DIAGNOSIS — R222 Localized swelling, mass and lump, trunk: Secondary | ICD-10-CM

## 2022-12-31 ENCOUNTER — Ambulatory Visit (INDEPENDENT_AMBULATORY_CARE_PROVIDER_SITE_OTHER): Payer: Medicare Other | Admitting: General Surgery

## 2022-12-31 ENCOUNTER — Encounter: Payer: Self-pay | Admitting: General Surgery

## 2022-12-31 VITALS — BP 138/81 | HR 78 | Temp 97.9°F | Resp 16 | Ht 64.0 in | Wt 229.0 lb

## 2022-12-31 DIAGNOSIS — R222 Localized swelling, mass and lump, trunk: Secondary | ICD-10-CM | POA: Insufficient documentation

## 2022-12-31 NOTE — Progress Notes (Unsigned)
[facility] History and Physical  Reason for Referral:*** Referring Physician: ***  Chief Complaint   New Patient (Initial Visit)     Kaitlin Meyers is a 67 y.o. female.  HPI: ***.  The *** started *** and has had a duration of ***.  It is associated with ***.  The *** is improved with ***, and is made worse with ***.    Quality*** Context***  Past Medical History:  Diagnosis Date   Arthritis    Atypical chest pain    Complication of anesthesia ~1976   difficulty awaking   Hx of migraines 01/15/11   "I've had 2 migraines w/o pain in my life; vision issues"   Hypothyroidism     Past Surgical History:  Procedure Laterality Date   BLADDER SURGERY  2011   "mesh"   CERVICAL DISC SURGERY  2007   KNEE ARTHROSCOPY  01/2010   right   PARTIAL KNEE ARTHROPLASTY  01/14/2011   Procedure: UNICOMPARTMENTAL KNEE;  Surgeon: Raymon Mutton, MD;  Location: [facility] OR;  Service: Orthopedics;  Laterality: Right;   REPLACEMENT UNICONDYLAR JOINT KNEE  01/14/11   "partial right medial replacement"   TEMPOROMANDIBULAR JOINT SURGERY  1987   VAGINAL HYSTERECTOMY  2011    Family History  Problem Relation Age of Onset   Arthritis Other    Diabetes Other    Stroke Paternal Grandmother    Diabetes Paternal Grandmother     Social History   Tobacco Use   Smoking status: Never   Smokeless tobacco: Never  Substance Use Topics   Alcohol use: Yes    Alcohol/week: 0.0 standard drinks of alcohol   Drug use: No    Medications: {medication reviewed/display:3041432} Allergies as of 12/31/2022       Reactions   Peanut Allergen Powder-dnfp Palpitations, Swelling   Sulfa Antibiotics Nausea Only, Swelling   Latex Hives   welps   Azithromycin Other (See Comments)   Pt states medication makes her have "hallucination"   Bupropion Other (See Comments)   Other    Pt reports not being able to take multiple pain medications. Unsure of drug names.    Peanut-containing Drug Products     peanuts   Penicillins Hives, Swelling, Other (See Comments)   Azo Yeast Plus [traumeel] Anxiety   Peanut Oil Palpitations        Medication List        Accurate as of December 31, 2022 10:46 AM. If you have any questions, ask your nurse or doctor.          STOP taking these medications    Claritin Allergy Childrens 5 MG/5ML syrup Generic drug: loratadine Stopped by: Lucretia Roers   EPINEPHrine 0.3 mg/0.3 mL Soaj injection Commonly known as: EpiPen Stopped by: Lucretia Roers       TAKE these medications    clonazePAM 0.5 MG tablet Commonly known as: KLONOPIN Take 0.5 mg by mouth.   CYMBALTA PO Take by mouth.   levothyroxine 112 MCG tablet Commonly known as: SYNTHROID Take 112 mcg by mouth every morning.   Vitamin D3 50 MCG (2000 UT) capsule Take 2,000 Units by mouth daily.   ZYRTEC PO Take by mouth.         ROS:  {Review of Systems:30496}  Blood pressure 138/81, pulse 78, temperature 97.9 F (36.6 C), temperature source Oral, resp. rate 16, height 5\' 4"  (1.626 m), weight 229 lb (103.9 kg), SpO2 96%. Physical Exam  Results: No results found for  this or any previous visit (from the past 48 hour(s)).  No results found.   Assessment & Plan:  Kaitlin Meyers is a 67 y.o. female with *** -*** -*** -Follow up ***  All questions were answered to the satisfaction of the patient and family***.  The risk and benefits of *** were discussed including but not limited to ***.  After careful consideration, Kaitlin Meyers has decided to ***.    Lucretia Roers 12/31/2022, 10:46 AM

## 2022-12-31 NOTE — Patient Instructions (Addendum)
Will get an [place] to look at this area further to ensure it is not deep to the muscle.  Will call with results and determine best option for excision if lipoma is seen on [place].

## 2023-01-06 ENCOUNTER — Ambulatory Visit (HOSPITAL_COMMUNITY)
Admission: RE | Admit: 2023-01-06 | Discharge: 2023-01-06 | Disposition: A | Discharge status: Home / Self Care | Payer: Medicare Other | Source: Ambulatory Visit | Attending: General Surgery | Admitting: General Surgery

## 2023-01-06 DIAGNOSIS — R222 Localized swelling, mass and lump, trunk: Secondary | ICD-10-CM | POA: Insufficient documentation

## 2023-01-07 DIAGNOSIS — Z0289 Encounter for other administrative examinations: Secondary | ICD-10-CM

## 2023-01-08 ENCOUNTER — Encounter: Payer: Self-pay | Admitting: Family Medicine

## 2023-01-08 ENCOUNTER — Ambulatory Visit: Payer: Medicare Other | Admitting: Family Medicine

## 2023-01-08 VITALS — BP 133/84 | HR 90 | Temp 98.6°F | Ht 63.5 in | Wt 225.0 lb

## 2023-01-08 DIAGNOSIS — E66812 Obesity, class 2: Secondary | ICD-10-CM | POA: Diagnosis not present

## 2023-01-08 DIAGNOSIS — M170 Bilateral primary osteoarthritis of knee: Secondary | ICD-10-CM | POA: Diagnosis not present

## 2023-01-08 DIAGNOSIS — Z6839 Body mass index (BMI) 39.0-39.9, adult: Secondary | ICD-10-CM

## 2023-01-08 DIAGNOSIS — E039 Hypothyroidism, unspecified: Secondary | ICD-10-CM

## 2023-01-08 NOTE — Assessment & Plan Note (Signed)
She reports good compliance on levothyroxine 112 mcg daily.  She reports having hypothyroidism for about 20 years and has not seen much change in her weight related to her thyroid  Continue levothyroxine as directed.

## 2023-01-08 NOTE — Assessment & Plan Note (Signed)
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 Charlann Boxer to discuss total knee replacement Check out chair exercises or water exercises at the gym

## 2023-01-08 NOTE — Progress Notes (Signed)
Office: [phone]  /  Fax: [phone]   Initial Visit  Kaitlin Meyers was seen in clinic today to evaluate for obesity. She is interested in losing weight to improve overall health and reduce the risk of weight related complications. She presents today to review program treatment options, initial physical assessment, and evaluation.     She was referred by: Specialist  When asked what else they would like to accomplish? She states: Improve existing medical conditions, Improve quality of life, and Improve appearance  Weight history: started to gain weight after hysterectomy in her 40s.  Her ovaries were also removed.  Prior to that, her weight was around 145 lb.  Eats out a lot.  Takes care of parents that live far.  Leads to eating out a lot.  When asked how has your weight affected you? She states: Contributed to orthopedic problems or mobility issues, Problems with eating patterns, and Has affected mood   Some associated conditions: arthritis, hypothyroidism  Contributing factors: Family history of obesity, Moderate to high levels of stress, Reduced physical activity, and Menopause  Weight promoting medications identified: Psychotropic medications  Current nutrition plan: None  Current level of physical activity: Limited due to chronic pain or orthopedic problems  Current or previous pharmacotherapy: None  Response to medication: Never tried medications   Past medical history includes:   Past Medical History:  Diagnosis Date   Arthritis    Atypical chest pain    Complication of anesthesia ~1976   difficulty awaking   Hx of migraines 01/15/11   "I've had 2 migraines w/o pain in my life; vision issues"   Hypothyroidism      Objective:   BP 133/84   Pulse 90   Temp 98.6 F (37 C)   Ht 5' 3.5" (1.613 m)   Wt 225 lb (102.1 kg)   SpO2 98%   BMI 39.23 kg/m  She was weighed on the bioimpedance scale: Body mass index is 39.23 kg/m.  Peak Weight:225 , Body  Fat%:47.2, Visceral Fat Rating:16, Weight trend over the last 12 months: Increasing  General:  Alert, oriented and cooperative. Patient is in no acute distress.  Respiratory: Normal respiratory effort, no problems with respiration noted   Gait: able to ambulate independently  Mental Status: Normal mood and affect. Normal behavior. Normal judgment and thought content.   DIAGNOSTIC DATA REVIEWED:  BMET    Component Value Date/Time   NA 140 01/08/2015 0451   K 3.8 01/08/2015 0451   CL 110 01/08/2015 0451   CO2 24 01/08/2015 0451   GLUCOSE 114 (H) 01/08/2015 0451   BUN 20 01/08/2015 0451   CREATININE 0.79 01/08/2015 0451   CALCIUM 9.2 01/08/2015 0451   GFRNONAA >60 01/08/2015 0451   GFRAA >60 01/08/2015 0451   No results found for: "HGBA1C" No results found for: "INSULIN" CBC    Component Value Date/Time   WBC 6.8 01/08/2015 0451   RBC 4.75 01/08/2015 0451   HGB 14.8 01/08/2015 0451   HCT 43.4 01/08/2015 0451   PLT 137 (L) 01/08/2015 0451   MCV 91.4 01/08/2015 0451   MCH 31.2 01/08/2015 0451   MCHC 34.1 01/08/2015 0451   RDW 13.3 01/08/2015 0451   Iron/TIBC/Ferritin/ %Sat No results found for: "IRON", "TIBC", "FERRITIN", "IRONPCTSAT" Lipid Panel  No results found for: "CHOL", "TRIG", "HDL", "CHOLHDL", "VLDL", "LDLCALC", "LDLDIRECT" Hepatic Function Panel     Component Value Date/Time   PROT 7.2 01/10/2011 1608   ALBUMIN 3.9 01/10/2011 1608   AST  19 01/10/2011 1608   ALT 23 01/10/2011 1608   ALKPHOS 87 01/10/2011 1608   BILITOT 0.2 (L) 01/10/2011 1608   No results found for: "TSH"   Assessment and Plan:   Primary osteoarthritis of both knees Assessment & Plan: She is managed by Dr Charlann Boxer for bilateral knee DJD pain which limits her walking time She has joined the gym but has not been going She is not taking any daily medication for pain  Begin active plan for weight loss Follow-up with Dr. Charlann Boxer to discuss total knee replacement Check out chair exercises or  water exercises at the gym    Class 2 obesity due to excess calories with body mass index (BMI) of 39.0 to 39.9 in adult, unspecified whether serious comorbidity present  Adult hypothyroidism Assessment & Plan: She reports good compliance on levothyroxine 112 mcg daily.  She reports having hypothyroidism for about 20 years and has not seen much change in her weight related to her thyroid  Continue levothyroxine as directed.         Obesity Treatment / Action Plan:  Patient will work on garnering support from family and friends to begin weight loss journey. Will work on eliminating or reducing the presence of highly palatable, calorie dense foods in the home. Will complete provided nutritional and psychosocial assessment questionnaire before the next appointment. Will be scheduled for indirect calorimetry to determine resting energy expenditure in a fasting state.  This will allow [place] to create a reduced calorie, high-protein meal plan to promote loss of fat mass while preserving muscle mass. Will think about ideas on how to incorporate physical activity into their daily routine. Counseled on the health benefits of losing 5%-15% of total body weight. Was counseled on nutritional approaches to weight loss and benefits of reducing processed foods and consuming plant-based foods and high quality protein as part of nutritional weight management. Was counseled on pharmacotherapy and role as an adjunct in weight management.   Obesity Education Performed Today:  She was weighed on the bioimpedance scale and results were discussed and documented in the synopsis.  We discussed obesity as a disease and the importance of a more detailed evaluation of all the factors contributing to the disease.  We discussed the importance of long term lifestyle changes which include nutrition, exercise and behavioral modifications as well as the importance of customizing this to her specific health and social  needs.  We discussed the benefits of reaching a healthier weight to alleviate the symptoms of existing conditions and reduce the risks of the biomechanical, metabolic and psychological effects of obesity.  MONCIA MCCLURE appears to be in the action stage of change and states they are ready to start intensive lifestyle modifications and behavioral modifications.  30 minutes was spent today on this visit including the above counseling, pre-visit chart review, and post-visit documentation.  Reviewed by clinician on day of visit: allergies, medications, problem list, medical history, surgical history, family history, social history, and previous encounter notes pertinent to obesity diagnosis.    Seymour Bars, D.O. DABFM, DABOM [facility] [phone] [place], [place] [address] [phone]

## 2023-01-14 NOTE — Progress Notes (Signed)
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 We can remove that at [facility] if she would like. Given the size I do not think we need to remove it in the clinic. We can set her up for a day for surgery if she would like.

## 2023-01-22 ENCOUNTER — Ambulatory Visit: Payer: Medicare Other | Admitting: Family Medicine

## 2023-01-28 ENCOUNTER — Encounter: Payer: Self-pay | Admitting: Family Medicine

## 2023-01-28 ENCOUNTER — Ambulatory Visit (INDEPENDENT_AMBULATORY_CARE_PROVIDER_SITE_OTHER): Payer: Medicare Other | Admitting: Family Medicine

## 2023-01-28 VITALS — BP 137/82 | HR 75 | Temp 97.6°F | Ht 63.5 in | Wt 226.0 lb

## 2023-01-28 DIAGNOSIS — E6609 Other obesity due to excess calories: Secondary | ICD-10-CM

## 2023-01-28 DIAGNOSIS — F419 Anxiety disorder, unspecified: Secondary | ICD-10-CM | POA: Insufficient documentation

## 2023-01-28 DIAGNOSIS — R0602 Shortness of breath: Secondary | ICD-10-CM | POA: Diagnosis not present

## 2023-01-28 DIAGNOSIS — R7309 Other abnormal glucose: Secondary | ICD-10-CM

## 2023-01-28 DIAGNOSIS — R948 Abnormal results of function studies of other organs and systems: Secondary | ICD-10-CM

## 2023-01-28 DIAGNOSIS — E039 Hypothyroidism, unspecified: Secondary | ICD-10-CM

## 2023-01-28 DIAGNOSIS — R5383 Other fatigue: Secondary | ICD-10-CM

## 2023-01-28 DIAGNOSIS — F32A Depression, unspecified: Secondary | ICD-10-CM

## 2023-01-28 DIAGNOSIS — E559 Vitamin D deficiency, unspecified: Secondary | ICD-10-CM

## 2023-01-28 DIAGNOSIS — Z6839 Body mass index (BMI) 39.0-39.9, adult: Secondary | ICD-10-CM

## 2023-01-28 DIAGNOSIS — Z13220 Encounter for screening for lipoid disorders: Secondary | ICD-10-CM

## 2023-01-28 DIAGNOSIS — E66812 Obesity, class 2: Secondary | ICD-10-CM

## 2023-01-28 DIAGNOSIS — Z636 Dependent relative needing care at home: Secondary | ICD-10-CM

## 2023-01-28 NOTE — Assessment & Plan Note (Signed)
No results found for: "TSH" She reports consistent use of levothyroxine 112 mcg daily on an empty stomach.  She complains of feeling hot all of the time despite what her TSH level has run  Check thyroid function with labs today

## 2023-01-28 NOTE — Assessment & Plan Note (Signed)
Reviewed the results of her indirect calorimetry today.  Her metabolic rate has dropped almost 600 cal/day comparing her indirect calorimetry to her expected metabolic rate on bioimpedance.  This may be due to inactivity, history of meal skipping and chronic dieting, postmenopausal state, lack of adequate protein intake.  We reviewed the importance of eating on a schedule including lean protein and fiber on a category 2 meal plan. Will be working to improve eating schedule, food choices, stress reduction and physical activity

## 2023-01-28 NOTE — Progress Notes (Signed)
At a Glance:  Vitals Temp: 97.6 F (36.4 C) BP: 137/82 Pulse Rate: 75 SpO2: 99 %   Anthropometric Measurements Height: 5' 3.5" (1.613 m) Weight: 226 lb (102.5 kg) BMI (Calculated): 39.4 Starting Weight: 226lb Peak Weight: 225lb   Body Composition  Body Fat %: 48.3 % Fat Mass (lbs): 109.2 lbs Muscle Mass (lbs): 111 lbs Total Body Water (lbs): 81 lbs Visceral Fat Rating : 16   Other Clinical Data RMR: 1051 Fasting: Yes Labs: Yes Today's Visit #: 1 Starting Date: 01/28/23    EKG: Normal sinus rhythm, rate 72.  Indirect Calorimeter completed today shows a VO2 of 153 and a REE of 1051.  Her calculated basal metabolic rate is 5284 thus her basal metabolic rate is worse than expected.  Chief Complaint:  Obesity   Subjective:  Kaitlin Meyers (MR# [id number]) is a 67 y.o. female who presents for evaluation and treatment of obesity and related comorbidities.   Jessalin is currently in the action stage of change and ready to dedicate time achieving and maintaining a healthier weight. Taurus is interested in becoming our patient and working on intensive lifestyle modifications including (but not limited to) diet and exercise for weight loss.  Almetta has been struggling with her weight. She has been unsuccessful in either losing weight, maintaining weight loss, or reaching her healthy weight goal.  Tamella's habits were reviewed today and are as follows: Her family eats meals together, she struggles with family and or coworkers weight loss sabotage, her desired weight loss is 70 lb, she started gaining weight after hysterectomy 12 years ago up from 150 lb. she is frequently drinking liquids with calories, she frequently makes poor food choices, she frequently eats larger portions than normal, and she struggles with emotional eating.   Other Fatigue She denies daytime somnolence and denies waking up still tired. Amrita generally gets 8 or 9 hours of sleep per night, and  states that she has generally restful sleep. Snoring is not present. Apneic episodes are not present. Epworth Sleepiness Score is 3.   Shortness of Breath Seante notes increasing shortness of breath with exercising and seems to be worsening over time with weight gain. She notes getting out of breath sooner with activity than she used to. This has gotten worse recently. Kortnie denies shortness of breath at rest or orthopnea.   Depression Screen Kelsha's Food and Mood (modified PHQ-9) score was 13.     10/22/2017   11:19 AM  Depression screen PHQ 2/9  Decreased Interest 0  Down, Depressed, Hopeless 0  PHQ - 2 Score 0     Assessment and Plan:   Other Fatigue Caira does feel that her weight is causing her energy to be lower than it should be. Fatigue may be related to obesity, depression or many other causes. Labs will be ordered, and in the meanwhile, Nyda will focus on self care including making healthy food choices, increasing physical activity and focusing on stress reduction.  Shortness of Breath Zuria does feel that she gets out of breath more easily that she used to when she exercises. Wing's shortness of breath appears to be obesity related and exercise induced. She has agreed to work on weight loss and gradually increase exercise to treat her exercise induced shortness of breath. Will continue to monitor closely.  Toshi had a positive depression screening. Depression is commonly associated with obesity and often results in emotional eating behaviors. We will monitor this closely and work on CBT to help  improve the non-hunger eating patterns. Referral to Psychology may be required if no improvement is seen as she continues in our clinic.    Problem List Items Addressed This Visit     Adult hypothyroidism   No results found for: "TSH" She reports consistent use of levothyroxine 112 mcg daily on an empty stomach.  She complains of feeling hot all of the time despite what her  TSH level has run  Check thyroid function with labs today      Anxiety and depression   She is currently on duloxetine per her PCP once daily.  She reports improvements in her mood on this medication. Her husband is supportive but also is a poor food influence.  Will be working on improving self-care, sleep at night, good nutrition and regular exercise.      Low basal metabolic rate   Reviewed the results of her indirect calorimetry today.  Her metabolic rate has dropped almost 600 cal/day comparing her indirect calorimetry to her expected metabolic rate on bioimpedance.  This may be due to inactivity, history of meal skipping and chronic dieting, postmenopausal state, lack of adequate protein intake.  We reviewed the importance of eating on a schedule including lean protein and fiber on a category 2 meal plan. Will be working to improve eating schedule, food choices, stress reduction and physical activity      Caregiver stress   She notes that caregiver stress has been a big source of her stress over the past few years.  She is caring for her aging parents, both in their 44s.  They live 40 minutes away and often times affects her evening schedule.  She is eating dinner out due to this schedule and sometimes skipping lunch.  In addition, she helps care for her grandkids 3 of which live locally.  We discussed the importance of self-care, taking time for meals and exercise.  Consider the addition of CBT with Dr. Dewaine Conger Recommended using the morning time for sitting and regular exercise as she is most consistent with being at home at this time of day      Other Visit Diagnoses       SOBOE (shortness of breath on exertion)    -  Primary     Other fatigue       Relevant Orders   EKG 12-Lead   TSH   T4, free   T3   Insulin, random   Folate   Comprehensive metabolic panel   Vitamin B12   CBC with Differential/Platelet     Vitamin D deficiency       Relevant Orders   VITAMIN D 25  Hydroxy (Vit-D Deficiency, Fractures)     Screening, lipid       Relevant Orders   Lipid panel     Elevated glucose       Relevant Orders   Hemoglobin A1c     Class 2 obesity due to excess calories with body mass index (BMI) of 39.0 to 39.9 in adult, unspecified whether serious comorbidity present           Dawnmarie is currently in the action stage of change and her goal is to continue with weight loss efforts. I recommend Alyn begin the structured treatment plan as follows:  She has agreed to Category 2 Plan  Exercise goals: Older adults should follow the adult guidelines. When older adults cannot meet the adult guidelines, they should be as physically active as their abilities and conditions will allow.  Behavioral modification strategies:increasing lean protein intake, decreasing simple carbohydrates, increasing vegetables, increase H2O intake, decrease liquid calories, increase high fiber foods, decreasing eating out, no skipping meals, meal planning and cooking strategies, keeping healthy foods in the home, better snacking choices, emotional eating strategies , family/coworker sabotage, holiday eating strategies, avoiding temptations, and planning for success  She was informed of the importance of frequent follow-up visits to maximize her success with intensive lifestyle modifications for her multiple health conditions. She was informed we would discuss her lab results at her next visit unless there is a critical issue that needs to be addressed sooner. Jerlene agreed to keep her next visit at the agreed upon time to discuss these results.  Objective:  General: Cooperative, alert, well developed, in no acute distress. HEENT: Conjunctivae and lids unremarkable. Cardiovascular: Regular rhythm.  Lungs: Normal work of breathing. Neurologic: No focal deficits.   Lab Results  Component Value Date   CREATININE 0.79 01/08/2015   BUN 20 01/08/2015   NA 140 01/08/2015   K 3.8 01/08/2015    CL 110 01/08/2015   CO2 24 01/08/2015   Lab Results  Component Value Date   ALT 23 01/10/2011   AST 19 01/10/2011   ALKPHOS 87 01/10/2011   BILITOT 0.2 (L) 01/10/2011   No results found for: "HGBA1C" No results found for: "INSULIN" No results found for: "TSH" No results found for: "CHOL", "HDL", "LDLCALC", "LDLDIRECT", "TRIG", "CHOLHDL" Lab Results  Component Value Date   WBC 6.8 01/08/2015   HGB 14.8 01/08/2015   HCT 43.4 01/08/2015   MCV 91.4 01/08/2015   PLT 137 (L) 01/08/2015   No results found for: "IRON", "TIBC", "FERRITIN"  Attestation Statements:  Reviewed by clinician on day of visit: allergies, medications, problem list, medical history, surgical history, family history, social history, and previous encounter notes.  Time spent on visit including pre-visit chart review and post-visit charting and care was 40 minutes.   Glennis Brink, DO

## 2023-01-28 NOTE — Assessment & Plan Note (Signed)
She is currently on duloxetine per her PCP once daily.  She reports improvements in her mood on this medication. Her husband is supportive but also is a poor food influence.  Will be working on improving self-care, sleep at night, good nutrition and regular exercise.

## 2023-01-28 NOTE — Assessment & Plan Note (Signed)
She notes that caregiver stress has been a big source of her stress over the past few years.  She is caring for her aging parents, both in their 55s.  They live 40 minutes away and often times affects her evening schedule.  She is eating dinner out due to this schedule and sometimes skipping lunch.  In addition, she helps care for her grandkids 3 of which live locally.  We discussed the importance of self-care, taking time for meals and exercise.  Consider the addition of CBT with Dr. Dewaine Conger Recommended using the morning time for sitting and regular exercise as she is most consistent with being at home at this time of day

## 2023-01-29 LAB — CBC WITH DIFFERENTIAL/PLATELET
Basophils Absolute: 0 10*3/uL (ref 0.0–0.2)
Basos: 1 %
EOS (ABSOLUTE): 0.1 10*3/uL (ref 0.0–0.4)
Eos: 2 %
Hematocrit: 43.8 % (ref 34.0–46.6)
Hemoglobin: 14.8 g/dL (ref 11.1–15.9)
Immature Grans (Abs): 0 10*3/uL (ref 0.0–0.1)
Immature Granulocytes: 1 %
Lymphocytes Absolute: 1.6 10*3/uL (ref 0.7–3.1)
Lymphs: 26 %
MCH: 31 pg (ref 26.6–33.0)
MCHC: 33.8 g/dL (ref 31.5–35.7)
MCV: 92 fL (ref 79–97)
Monocytes Absolute: 0.3 10*3/uL (ref 0.1–0.9)
Monocytes: 5 %
Neutrophils Absolute: 4.2 10*3/uL (ref 1.4–7.0)
Neutrophils: 65 %
Platelets: 158 10*3/uL (ref 150–450)
RBC: 4.78 x10E6/uL (ref 3.77–5.28)
RDW: 13.1 % (ref 11.7–15.4)
WBC: 6.3 10*3/uL (ref 3.4–10.8)

## 2023-01-29 LAB — COMPREHENSIVE METABOLIC PANEL
ALT: 22 [IU]/L (ref 0–32)
AST: 22 [IU]/L (ref 0–40)
Albumin: 4.3 g/dL (ref 3.9–4.9)
Alkaline Phosphatase: 93 [IU]/L (ref 44–121)
BUN/Creatinine Ratio: 18 (ref 12–28)
BUN: 15 mg/dL (ref 8–27)
Bilirubin Total: 0.3 mg/dL (ref 0.0–1.2)
CO2: 22 mmol/L (ref 20–29)
Calcium: 9.7 mg/dL (ref 8.7–10.3)
Chloride: 104 mmol/L (ref 96–106)
Creatinine, Ser: 0.82 mg/dL (ref 0.57–1.00)
Globulin, Total: 2.4 g/dL (ref 1.5–4.5)
Glucose: 95 mg/dL (ref 70–99)
Potassium: 4.8 mmol/L (ref 3.5–5.2)
Sodium: 142 mmol/L (ref 134–144)
Total Protein: 6.7 g/dL (ref 6.0–8.5)
eGFR: 78 mL/min/{1.73_m2} (ref >59)

## 2023-01-29 LAB — LIPID PANEL
Chol/HDL Ratio: 4 {ratio} (ref 0.0–4.4)
Cholesterol, Total: 243 mg/dL — ABNORMAL HIGH (ref 100–199)
HDL: 61 mg/dL (ref >39)
LDL Chol Calc (NIH): 165 mg/dL — ABNORMAL HIGH (ref 0–99)
Triglycerides: 98 mg/dL (ref 0–149)
VLDL Cholesterol Cal: 17 mg/dL (ref 5–40)

## 2023-01-29 LAB — INSULIN, RANDOM: INSULIN: 29.1 u[IU]/mL — ABNORMAL HIGH (ref 2.6–24.9)

## 2023-01-29 LAB — TSH: TSH: 1.45 u[IU]/mL (ref 0.450–4.500)

## 2023-01-29 LAB — FOLATE: Folate: 8.9 ng/mL (ref >3.0)

## 2023-01-29 LAB — T4, FREE: Free T4: 1.4 ng/dL (ref 0.82–1.77)

## 2023-01-29 LAB — VITAMIN D 25 HYDROXY (VIT D DEFICIENCY, FRACTURES): Vit D, 25-Hydroxy: 55.3 ng/mL (ref 30.0–100.0)

## 2023-01-29 LAB — VITAMIN B12: Vitamin B-12: 302 pg/mL (ref 232–1245)

## 2023-01-29 LAB — HEMOGLOBIN A1C
Est. average glucose Bld gHb Est-mCnc: 120 mg/dL
Hgb A1c MFr Bld: 5.8 % — ABNORMAL HIGH (ref 4.8–5.6)

## 2023-01-29 LAB — T3: T3, Total: 94 ng/dL (ref 71–180)

## 2023-02-06 ENCOUNTER — Ambulatory Visit: Payer: Medicare Other | Admitting: Family Medicine

## 2023-02-18 ENCOUNTER — Ambulatory Visit: Payer: Medicare Other | Admitting: Family Medicine

## 2023-02-24 ENCOUNTER — Ambulatory Visit: Payer: Medicare Other | Admitting: Bariatrics

## 2023-03-03 ENCOUNTER — Ambulatory Visit: Payer: Medicare Other | Admitting: Family Medicine

## 2023-03-10 ENCOUNTER — Ambulatory Visit (INDEPENDENT_AMBULATORY_CARE_PROVIDER_SITE_OTHER): Payer: Medicare Other | Admitting: Family Medicine

## 2023-03-10 ENCOUNTER — Encounter: Payer: Self-pay | Admitting: Family Medicine

## 2023-03-10 VITALS — BP 121/70 | HR 67 | Temp 98.0°F | Ht 63.5 in | Wt 227.0 lb

## 2023-03-10 DIAGNOSIS — E538 Deficiency of other specified B group vitamins: Secondary | ICD-10-CM | POA: Diagnosis not present

## 2023-03-10 DIAGNOSIS — R7303 Prediabetes: Secondary | ICD-10-CM | POA: Insufficient documentation

## 2023-03-10 DIAGNOSIS — E785 Hyperlipidemia, unspecified: Secondary | ICD-10-CM | POA: Insufficient documentation

## 2023-03-10 DIAGNOSIS — R948 Abnormal results of function studies of other organs and systems: Secondary | ICD-10-CM

## 2023-03-10 DIAGNOSIS — Z6839 Body mass index (BMI) 39.0-39.9, adult: Secondary | ICD-10-CM

## 2023-03-10 DIAGNOSIS — E66812 Obesity, class 2: Secondary | ICD-10-CM

## 2023-03-10 DIAGNOSIS — E88819 Insulin resistance, unspecified: Secondary | ICD-10-CM | POA: Insufficient documentation

## 2023-03-10 MED ORDER — CYANOCOBALAMIN 500 MCG PO TABS: 500.0000 ug | ORAL_TABLET | Freq: Every day | ORAL | 0 refills | Status: DC

## 2023-03-10 NOTE — Assessment & Plan Note (Addendum)
He has declined use of statin medication for hyperlipidemia.  Her ASCVD risk score is moderately high.  She reports a positive family history for hyperlipidemia.  She has started working on her lifestyle changes to include a lower saturated fat diet.  She has limited physical activity.  Plan to recheck fasting lipid panel in the next 6 months or defer plan of care to her PCP based on her personal cardiac risk factors.  The 10-year ASCVD risk score (Arnett DK, et al., 2019) is: 7.2%   Values used to calculate the score:     Age: 37 years     Sex: Female     Is Non-Hispanic African American: No     Diabetic: No     Tobacco smoker: No     Systolic Blood Pressure: 121 mmHg     Is BP treated: No     HDL Cholesterol: 61 mg/dL     Total Cholesterol: 243 mg/dL

## 2023-03-10 NOTE — Assessment & Plan Note (Signed)
Reviewed labs from last visit.  Her B12 level was in the low normal range at 302.  She has complaints of fatigue.  She denies intake of a vegan or vegetarian diet.  She denies paresthesias or brain fog.  She is currently not taking a multivitamin or B12 supplement.  Begin vitamin B12  500 mcg once daily

## 2023-03-10 NOTE — Patient Instructions (Addendum)
STAY LOW CARB WITH MEAL PLAN ~1200 CAL/ DAY AVOID BREAD, RICE, PASTA, POTATOES, CEREAL, CRACKERS, CHIPS, PIZZA, ETC  OPT FOR LOWER CARB ALTERNATIVES GET IN PLENTY OF NON STARCHY VEGGIES ALLOW FRESH OR FROZEN FRUIT FOR YOUR CARB SOURCES  AIM TO GET TO THE GYM AT LEAST 1 DAY A WEEK  HYDRATE WELL WITH WATER AND SUGAR FREE ELECTROLYTE DRINKS LIKE BODY ARMOUR LYTE OR BODY ARMOUR ZERO SUGAR  MAKE TIME FOR YOU!!  AVOID HIGH SUGAR FOODS AND DRINKS BRING HIGH PROTEIN SNACKS WHEN TRAVELING: A PROTEIN BAR A PACK OF NUTS A CHOMPS MEAT STICK THE ONLY BEAN DRIED EDAMAME

## 2023-03-10 NOTE — Progress Notes (Signed)
Office: [phone]  /  Fax: [phone]  WEIGHT SUMMARY AND BIOMETRICS  Starting Date: 01/28/23  Starting Weight: 226lb   Weight Lost Since Last Visit: 0lb   Vitals Temp: 98 F (36.7 C) BP: 121/70 Pulse Rate: 67 SpO2: 96 %   Body Composition  Body Fat %: 48.3 % Fat Mass (lbs): 110 lbs Muscle Mass (lbs): 111.8 lbs Total Body Water (lbs): 80.6 lbs Visceral Fat Rating : 16    HPI  Chief Complaint: OBESITY  Kaitlin Meyers is here to discuss her progress with her obesity treatment plan. She is on the the Category 2 Plan and states she is following her eating plan approximately 50 % of the time. She states she is exercising 30 minutes 2 times per week.  Interval History:  Since last office visit she is up 1 lb in the past month She has had some life stressors and enjoyed the holidays She is retaining fluid She is eating out frequently due to her schedule She has not been doing any exercise but she has been active Caregiver stress and limited time have been barriers to her progress and she lost her meal plan  Pharmacotherapy: none  PHYSICAL EXAM:  Blood pressure 121/70, pulse 67, temperature 98 F (36.7 C), height 5' 3.5" (1.613 m), weight 227 lb (103 kg), SpO2 96%. Body mass index is 39.58 kg/m.  General: She is overweight, cooperative, alert, well developed, and in no acute distress. PSYCH: Has normal mood, affect and thought process.   Lungs: Normal breathing effort, no conversational dyspnea.   ASSESSMENT AND PLAN  TREATMENT PLAN FOR OBESITY:  Recommended Dietary Goals  Kaitlin Meyers is currently in the action stage of change. As such, her goal is to continue weight management plan. She has agreed to following a lower carbohydrate, vegetable and lean protein rich diet plan.  Behavioral Intervention  We discussed the following Behavioral Modification Strategies today: increasing lean protein intake to established goals, increasing vegetables, increasing lower  glycemic fruits, increasing water intake , work on meal planning and preparation, reading food labels , decreasing eating out or consumption of processed foods, and making healthy choices when eating convenient foods, practice mindfulness eating and understand the difference between hunger signals and cravings, avoiding temptations and identifying enticing environmental cues, planning for success, and continue to work on maintaining a reduced calorie state, getting the recommended amount of protein, incorporating whole foods, making healthy choices, staying well hydrated and practicing mindfulness when eating.. Reviewed dietary change goals together on AVS, changing to a reduced kcal lower carb plan  Additional resources provided today: NA  Recommended Physical Activity Goals  Kaitlin Meyers has been advised to work up to 150 minutes of moderate intensity aerobic activity a week and strengthening exercises 2-3 times per week for cardiovascular health, weight loss maintenance and preservation of muscle mass.   She has agreed to Think about enjoyable ways to increase daily physical activity and overcoming barriers to exercise and Increase physical activity in their day and reduce sedentary time (increase NEAT).  Pharmacotherapy changes for the treatment of obesity: none  ASSOCIATED CONDITIONS ADDRESSED TODAY  Hyperlipidemia, unspecified hyperlipidemia type Assessment & Plan: He has declined use of statin medication for hyperlipidemia.  Her ASCVD risk score is moderately high.  She reports a positive family history for hyperlipidemia.  She has started working on her lifestyle changes to include a lower saturated fat diet.  She has limited physical activity.  Plan to recheck fasting lipid panel in the next 6 months or  defer plan of care to her PCP based on her personal cardiac risk factors.  The 10-year ASCVD risk score (Arnett DK, et al., 2019) is: 7.2%   Values used to calculate the score:     Age: 68  years     Sex: Female     Is Non-Hispanic African American: No     Diabetic: No     Tobacco smoker: No     Systolic Blood Pressure: 121 mmHg     Is BP treated: No     HDL Cholesterol: 61 mg/dL     Total Cholesterol: 243 mg/dL    Low serum vitamin Z61 Assessment & Plan: Reviewed labs from last visit.  Her B12 level was in the low normal range at 302.  She has complaints of fatigue.  She denies intake of a vegan or vegetarian diet.  She denies paresthesias or brain fog.  She is currently not taking a multivitamin or B12 supplement.  Begin vitamin B12  500 mcg once daily  Orders: -     Cyanocobalamin; Take 1 tablet (500 mcg total) by mouth daily.  Dispense: 30 tablet; Refill: 0  Class 2 obesity due to excess calories with body mass index (BMI) of 39.0 to 39.9 in adult, unspecified whether serious comorbidity present  Low basal metabolic rate Assessment & Plan: She had a low metabolic rate on her last visit with a resting energy expenditure of 1051 cal/day. Recommend tracking daily caloric intake with a goal of about 1200 cal/day which should include at least 80 g of protein daily.  Encouraged her to improve eating on a schedule, intake of lean protein, getting 7 to 8 hours of sleep at night and increasing walking time.  Plan to recheck IC in 6 to 12 months   Prediabetes Assessment & Plan: New.  Reviewed lab from last visit.  Her A1c was elevated at 5.8 in the prediabetic range.  She is at risk for type 2 diabetes, over consuming excess starches and sweets with limited physical activity and a BMI of 39.  Started reducing her intake of added sugar and refined carbohydrates with room for improvement with both diet and exercise.  She has never taken metformin.  Reviewed lifestyle change goals together on after visit summary.  Consider use of metformin if A1c is not improving in the next 3 to 4 months   Insulin resistance Assessment & Plan: Reviewed lab results with patient.   Fasting insulin high at 29.1.  We discussed pathophysiology of insulin resistance and its risk for type 2 diabetes and metabolic syndrome.  She understands that insulin resistance will be best served by reducing her intake of added sugar and refined carbohydrates, increase physical activities.  Continue working on healthy lifestyle changes as reviewed together on after visit summary.  Consider the addition of metformin.  Recheck fasting insulin in the next 4 months       She was informed of the importance of frequent follow up visits to maximize her success with intensive lifestyle modifications for her multiple health conditions.   ATTESTASTION STATEMENTS:  Reviewed by clinician on day of visit: allergies, medications, problem list, medical history, surgical history, family history, social history, and previous encounter notes pertinent to obesity diagnosis.   I have personally spent 30 minutes total time today in preparation, patient care, nutritional counseling and documentation for this visit, including the following: review of clinical lab tests; review of medical tests/procedures/services.      Glennis Brink, DO DABFM,  DABOM [facility] [address] [place], [place] [address] [phone]

## 2023-03-10 NOTE — Assessment & Plan Note (Signed)
Reviewed lab results with patient.  Fasting insulin high at 29.1.  We discussed pathophysiology of insulin resistance and its risk for type 2 diabetes and metabolic syndrome.  She understands that insulin resistance will be best served by reducing her intake of added sugar and refined carbohydrates, increase physical activities.  Continue working on healthy lifestyle changes as reviewed together on after visit summary.  Consider the addition of metformin.  Recheck fasting insulin in the next 4 months

## 2023-03-10 NOTE — Assessment & Plan Note (Signed)
New.  Reviewed lab from last visit.  Her A1c was elevated at 5.8 in the prediabetic range.  She is at risk for type 2 diabetes, over consuming excess starches and sweets with limited physical activity and a BMI of 39.  Started reducing her intake of added sugar and refined carbohydrates with room for improvement with both diet and exercise.  She has never taken metformin.  Reviewed lifestyle change goals together on after visit summary.  Consider use of metformin if A1c is not improving in the next 3 to 4 months

## 2023-03-10 NOTE — Assessment & Plan Note (Signed)
She had a low metabolic rate on her last visit with a resting energy expenditure of 1051 cal/day. Recommend tracking daily caloric intake with a goal of about 1200 cal/day which should include at least 80 g of protein daily.  Encouraged her to improve eating on a schedule, intake of lean protein, getting 7 to 8 hours of sleep at night and increasing walking time.  Plan to recheck IC in 6 to 12 months

## 2023-03-11 ENCOUNTER — Encounter: Payer: Self-pay | Admitting: General Surgery

## 2023-03-11 ENCOUNTER — Ambulatory Visit: Payer: Medicare Other | Admitting: General Surgery

## 2023-03-11 VITALS — BP 119/75 | HR 82 | Temp 98.1°F | Resp 14 | Ht 63.5 in | Wt 229.0 lb

## 2023-03-11 DIAGNOSIS — D171 Benign lipomatous neoplasm of skin and subcutaneous tissue of trunk: Secondary | ICD-10-CM | POA: Insufficient documentation

## 2023-03-11 NOTE — Patient Instructions (Signed)
Lipoma Removal  Lipoma removal is a surgical procedure to remove a lipoma, which is a noncancerous (benign) tumor that is made up of fat cells. Most lipomas are small and painless and do not require treatment. They can form in many areas of the body but are most common under the skin of the back, arms, shoulders, buttocks, and thighs. You may need lipoma removal if you have a lipoma that is large, growing, or causing discomfort. Lipoma removal may also be done for cosmetic reasons. Tell a health care provider about: Any allergies you have. All medicines you are taking, including vitamins, herbs, eye drops, creams, and over-the-counter medicines. Any problems you or family members have had with anesthetic medicines. Any bleeding problems you have. Any surgeries you have had. Any medical conditions you have. Whether you are pregnant or may be pregnant. What are the risks? Generally, this is a safe procedure. However, problems may occur, including: Infection. Bleeding. Scarring. Allergic reactions to medicines. Damage to nearby structures or organs, such as damage to nerves or blood vessels near the lipoma. What happens before the procedure? Medicines Ask your health care provider about: Changing or stopping your regular medicines. This is especially important if you are taking diabetes medicines or blood thinners. Taking medicines such as aspirin and ibuprofen. These medicines can thin your blood. Do not take these medicines unless your health care provider tells you to take them. Taking over-the-counter medicines, vitamins, herbs, and supplements. General instructions You will have a physical exam. Your health care provider will check the size of the lipoma and whether it can be removed easily. You may have a biopsy and imaging tests, such as X-rays, a CT scan, and an MRI. Do not use any products that contain nicotine or tobacco for at least 4 weeks before the procedure. These products  include cigarettes, chewing tobacco, and vaping devices, such as e-cigarettes. If you need help quitting, ask your health care provider. Ask your health care provider: How your surgery site will be marked. What steps will be taken to help prevent infection. These may include: Washing skin with a germ-killing soap. Taking antibiotic medicine. If you will be going home right after the procedure, plan to have a responsible adult: Take you home from the hospital or clinic. You will not be allowed to drive. Care for you for the time you are told. What happens during the procedure?  An IV will be inserted into one of your veins. You will be given one or more of the following: A medicine to help you relax (sedative). A medicine to numb the area (local anesthetic). A medicine to make you fall asleep (general anesthetic). A medicine that is injected into an area of your body to numb everything below the injection site (regional anesthetic). An incision will be made into the skin over the lipoma or very near the lipoma. The incision may be made in a natural skin line or crease. Tissues, nerves, and blood vessels near the lipoma will be moved out of the way. The lipoma and the capsule that surrounds it will be separated from the surrounding tissues. The lipoma will be removed. The incision may be closed with stitches (sutures). A bandage (dressing) will be placed over the incision. The procedure may vary among health care providers and hospitals. What happens after the procedure? Your blood pressure, heart rate, breathing rate, and blood oxygen level will be monitored until you leave the hospital or clinic. If you were prescribed an antibiotic medicine,  use it as told by your health care provider. Do not stop using the antibiotic even if you start to feel better. If you were given a sedative during the procedure, it can affect you for several hours. Do not drive or operate machinery until your health  care provider says that it is safe. Where to find more information OrthoInfo: orthoinfo.aaos.org Summary Before the procedure, follow instructions from your health care provider about eating and drinking, and changing or stopping your regular medicines. This is especially important if you are taking diabetes medicines or blood thinners. After the lipoma is removed, the incision may be closed with stitches (sutures) and covered with a bandage (dressing). If you were given a sedative during the procedure, it can affect you for several hours. Do not drive or operate machinery until your health care provider says that it is safe. This information is not intended to replace advice given to you by your health care provider. Make sure you discuss any questions you have with your health care provider. Document Revised: 02/16/2021 Document Reviewed: 02/16/2021 Elsevier Patient Education  2024 [employer].

## 2023-03-11 NOTE — Progress Notes (Unsigned)
[facility] History and Physical  Reason for Referral:*** Referring Physician: ***  Chief Complaint   Follow-up     Kaitlin Meyers is a 68 y.o. female.  HPI: ***.  The *** started *** and has had a duration of ***.  It is associated with ***.  The *** is improved with ***, and is made worse with ***.    Quality*** Context***  Past Medical History:  Diagnosis Date   Anxiety    Arthritis    Atypical chest pain    Back pain    Complication of anesthesia ~1976   difficulty awaking   Depression    Edema    GERD (gastroesophageal reflux disease)    High blood pressure    Hx of migraines 01/15/2011   "I've had 2 migraines w/o pain in my life; vision issues"   Hyperlipidemia    Hypothyroidism    Joint pain    Palpitation    SOB (shortness of breath)    Vitamin D deficiency     Past Surgical History:  Procedure Laterality Date   BLADDER SURGERY  2011   "mesh"   CERVICAL DISC SURGERY  2007   KNEE ARTHROSCOPY  01/2010   right   PARTIAL KNEE ARTHROPLASTY  01/14/2011   Procedure: UNICOMPARTMENTAL KNEE;  Surgeon: Raymon Mutton, MD;  Location: [facility] OR;  Service: Orthopedics;  Laterality: Right;   REPLACEMENT UNICONDYLAR JOINT KNEE  01/14/11   "partial right medial replacement"   TEMPOROMANDIBULAR JOINT SURGERY  1987   VAGINAL HYSTERECTOMY  2011    Family History  Problem Relation Age of Onset   Hypertension Mother    Depression Mother    Thyroid disease Father    Stroke Father    Hyperlipidemia Father    Hypertension Father    Stroke Paternal Grandmother    Diabetes Paternal Grandmother    Arthritis Other    Diabetes Other     Social History   Tobacco Use   Smoking status: Never   Smokeless tobacco: Never  Vaping Use   Vaping status: Never Used  Substance Use Topics   Alcohol use: Yes    Alcohol/week: 0.0 standard drinks of alcohol   Drug use: No    Medications: {medication reviewed/display:3041432} Allergies as of 03/11/2023        Reactions   Peanut Allergen Powder-dnfp Palpitations, Swelling   Sulfa Antibiotics Nausea Only, Swelling   Latex Hives   welps   Azithromycin Other (See Comments)   Pt states medication makes her have "hallucination"   Bupropion Other (See Comments)   Other    Pt reports not being able to take multiple pain medications. Unsure of drug names.    Peanut-containing Drug Products    peanuts   Penicillins Hives, Swelling, Other (See Comments)   Azo Yeast Plus [traumeel] Anxiety   Peanut Oil Palpitations        Medication List        Accurate as of March 11, 2023 10:47 AM. If you have any questions, ask your nurse or doctor.          clonazePAM 0.5 MG tablet Commonly known as: KLONOPIN Take 0.5 mg by mouth.   cyanocobalamin 500 MCG tablet Commonly known as: VITAMIN B12 Take 1 tablet (500 mcg total) by mouth daily.   CYMBALTA PO Take by mouth.   levothyroxine 112 MCG tablet Commonly known as: SYNTHROID Take 112 mcg by mouth every morning.   Vitamin D3 50 MCG (2000 UT) capsule  Take 2,000 Units by mouth daily.   ZYRTEC PO Take by mouth.         ROS:  {Review of Systems:30496}  Blood pressure 119/75, pulse 82, temperature 98.1 F (36.7 C), temperature source Oral, resp. rate 14, height 5' 3.5" (1.613 m), weight 229 lb (103.9 kg), SpO2 94%. Physical Exam  Results: No results found for this or any previous visit (from the past 48 hours).  No results found.   Assessment & Plan:  Kaitlin Meyers is a 68 y.o. female with *** -*** -*** -Follow up ***  All questions were answered to the satisfaction of the patient and family***.  The risk and benefits of *** were discussed including but not limited to ***.  After careful consideration, Kaitlin Meyers has decided to ***.    Lucretia Roers 03/11/2023, 10:47 AM

## 2023-03-12 NOTE — H&P (Signed)
[facility] History and Physical   Chief Complaint   Follow-up     Kaitlin Meyers is a 68 y.o. female.  HPI: [name] is a 68 yo who has a large right upper back lipoma that was verified on [place]. She has decided she does want to get this removed. She had questions about [facility] cleanliness after her granddaughter brought up concerns. I have reassured her that [facility] uses the industry's standards and [facility] standards when it comes to cleaning the space and equipment.   Past Medical History:  Diagnosis Date   Anxiety    Arthritis    Atypical chest pain    Back pain    Complication of anesthesia ~1976   difficulty awaking   Depression    Edema    GERD (gastroesophageal reflux disease)    High blood pressure    Hx of migraines 01/15/2011   "I've had 2 migraines w/o pain in my life; vision issues"   Hyperlipidemia    Hypothyroidism    Joint pain    Palpitation    SOB (shortness of breath)    Vitamin D deficiency     Past Surgical History:  Procedure Laterality Date   BLADDER SURGERY  2011   "mesh"   CERVICAL DISC SURGERY  2007   KNEE ARTHROSCOPY  01/2010   right   PARTIAL KNEE ARTHROPLASTY  01/14/2011   Procedure: UNICOMPARTMENTAL KNEE;  Surgeon: Raymon Mutton, MD;  Location: [facility] OR;  Service: Orthopedics;  Laterality: Right;   REPLACEMENT UNICONDYLAR JOINT KNEE  01/14/11   "partial right medial replacement"   TEMPOROMANDIBULAR JOINT SURGERY  1987   VAGINAL HYSTERECTOMY  2011    Family History  Problem Relation Age of Onset   Hypertension Mother    Depression Mother    Thyroid disease Father    Stroke Father    Hyperlipidemia Father    Hypertension Father    Stroke Paternal Grandmother    Diabetes Paternal Grandmother    Arthritis Other    Diabetes Other     Social History   Tobacco Use   Smoking status: Never   Smokeless tobacco: Never  Vaping Use   Vaping status: Never Used  Substance Use Topics   Alcohol use: Yes     Alcohol/week: 0.0 standard drinks of alcohol   Drug use: No    Medications: I have reviewed the patient's current medications. Allergies as of 03/11/2023       Reactions   Peanut Allergen Powder-dnfp Palpitations, Swelling   Sulfa Antibiotics Nausea Only, Swelling   Latex Hives   welps   Azithromycin Other (See Comments)   Pt states medication makes her have "hallucination"   Bupropion Other (See Comments)   Other    Pt reports not being able to take multiple pain medications. Unsure of drug names.    Peanut-containing Drug Products    peanuts   Penicillins Hives, Swelling, Other (See Comments)   Azo Yeast Plus [traumeel] Anxiety   Peanut Oil Palpitations        Medication List        Accurate as of March 11, 2023 10:47 AM. If you have any questions, ask your nurse or doctor.          clonazePAM 0.5 MG tablet Commonly known as: KLONOPIN Take 0.5 mg by mouth.   cyanocobalamin 500 MCG tablet Commonly known as: VITAMIN B12 Take 1 tablet (500 mcg total) by mouth daily.   CYMBALTA PO Take  by mouth.   levothyroxine 112 MCG tablet Commonly known as: SYNTHROID Take 112 mcg by mouth every morning.   Vitamin D3 50 MCG (2000 UT) capsule Take 2,000 Units by mouth daily.   ZYRTEC PO Take by mouth.         ROS:  A comprehensive review of systems was negative except for: Musculoskeletal: positive for right upper back lipoma   Blood pressure 119/75, pulse 82, temperature 98.1 F (36.7 C), temperature source Oral, resp. rate 14, height 5' 3.5" (1.613 m), weight 229 lb (103.9 kg), SpO2 94%. Physical Exam Vitals reviewed.  HENT:     Head: Normocephalic.     Nose: Nose normal.  Eyes:     Extraocular Movements: Extraocular movements intact.  Cardiovascular:     Rate and Rhythm: Normal rate and regular rhythm.  Pulmonary:     Effort: Pulmonary effort is normal.     Breath sounds: Normal breath sounds.  Abdominal:     General: There is no distension.      Palpations: Abdomen is soft.  Musculoskeletal:        General: Normal range of motion.     Comments: Right upper back with palpable superficial mass   Skin:    General: Skin is warm.  Neurological:     General: No focal deficit present.     Mental Status: She is alert and oriented to person, place, and time.  Psychiatric:        Mood and Affect: Mood normal.        Behavior: Behavior normal.        Thought Content: Thought content normal.     Results: PROCEDURE: US SOFT TISSUE   HISTORY: Patient is a 68 y/o F with back mass x 30-40 years (right upper back mass possible lipoma).   COMPARISON: .   TECHNIQUE: Two-dimensional grayscale and color Doppler ultrasound of the of the right upper back region of interest was performed.   FINDINGS: There is a 9.5 x 2.4 x 7.9 cm isoechoic and avascular lesion with ill-defined borders visualized identified within the right upper back, area of concern. No focal fluid collections are demonstrated.   IMPRESSION: 1. Right upper back 9.5 cm isoechoic soft tissue lesion, likely representing a benign lipoma.   Thank you for allowing [place] to assist in the care of this patient.     Electronically Signed   By: Lestine Box M.D.   On: 01/11/2023 08:53   Assessment & Plan:  Kaitlin Meyers is a 68 y.o. female with a large back lipoma. We plan to removed this in the OR. Discussed risk of bleeding, infection, seroma, recurrence.     All questions were answered to the satisfaction of the patient.   Lucretia Roers 03/11/2023, 10:47 AM

## 2023-04-02 ENCOUNTER — Ambulatory Visit: Payer: Medicare Other | Admitting: Family Medicine

## 2023-04-08 ENCOUNTER — Encounter: Payer: Self-pay | Admitting: Family Medicine

## 2023-04-08 ENCOUNTER — Ambulatory Visit (INDEPENDENT_AMBULATORY_CARE_PROVIDER_SITE_OTHER): Payer: Medicare Other | Admitting: Family Medicine

## 2023-04-08 VITALS — BP 117/77 | HR 80 | Temp 98.2°F | Ht 63.5 in | Wt 224.0 lb

## 2023-04-08 DIAGNOSIS — E66812 Obesity, class 2: Secondary | ICD-10-CM

## 2023-04-08 DIAGNOSIS — Z7289 Other problems related to lifestyle: Secondary | ICD-10-CM

## 2023-04-08 DIAGNOSIS — R7303 Prediabetes: Secondary | ICD-10-CM

## 2023-04-08 DIAGNOSIS — R948 Abnormal results of function studies of other organs and systems: Secondary | ICD-10-CM

## 2023-04-08 DIAGNOSIS — E538 Deficiency of other specified B group vitamins: Secondary | ICD-10-CM

## 2023-04-08 DIAGNOSIS — Z6839 Body mass index (BMI) 39.0-39.9, adult: Secondary | ICD-10-CM

## 2023-04-08 DIAGNOSIS — Z9189 Other specified personal risk factors, not elsewhere classified: Secondary | ICD-10-CM | POA: Insufficient documentation

## 2023-04-08 NOTE — Patient Instructions (Signed)
 Begin tracking daily intake on the [employer] ap Aim for 1200 cal day  This should include 85-90 g of protein intake daily  Continue to try out meal prep/ low carb recipes at home  Aim for a good 20 min of walking or biking 5 days/ wk

## 2023-04-08 NOTE — Progress Notes (Signed)
 Office: [phone]  /  Fax: [phone]  WEIGHT SUMMARY AND BIOMETRICS  Starting Date: 01/28/23  Starting Weight: 226lb   Weight Lost Since Last Visit: 3lb   Vitals Temp: 98.2 F (36.8 C) BP: 117/77 Pulse Rate: 80 SpO2: 98 %   Body Composition  Body Fat %: 47.4 % Fat Mass (lbs): 106.4 lbs Muscle Mass (lbs): 112 lbs Total Body Water (lbs): 79.6 lbs Visceral Fat Rating : 16     HPI  Chief Complaint: OBESITY  Kaitlin Meyers is here to discuss her progress with her obesity treatment plan. She is on the following a lower carbohydrate, vegetable and lean protein rich diet plan and states she is following her eating plan approximately 50 % of the time. She states she is walking for 20 minutes for 5-7 times per week.   Interval History:  Since last office visit she is down 3 lb She is up 0.2 lb of muscle mass and down 3.6 lb of body fat since last visit She feels adequately full doing a low carb diet She was away for a week in [place] taking care of her grandkids and ate out but stayed mindful of her choices She is prioritizing lean protein and healthy fats She plans to work on getting in more veggies but struggles when eating out locally She is making unhealthy foods at home for others She is drinking water and Body [employer] LYTE She has not yet started to exercise She plans to start walking outdoors She also has a stationary bike at home This gives her a net weight loss of 2 lb in the past 2 mos  Pharmacotherapy: None  PHYSICAL EXAM:  Blood pressure 117/77, pulse 80, temperature 98.2 F (36.8 C), height 5' 3.5" (1.613 m), weight 224 lb (101.6 kg), SpO2 98%. Body mass index is 39.06 kg/m.  General: She is overweight, cooperative, alert, well developed, and in no acute distress. PSYCH: Has normal mood, affect and thought process.   Lungs: Normal breathing effort, no conversational dyspnea.   ASSESSMENT AND PLAN  TREATMENT PLAN FOR OBESITY:  Recommended Dietary  Goals  Kaitlin Meyers is currently in the action stage of change. As such, her goal is to continue weight management plan. She has agreed to following a lower carbohydrate, vegetable and lean protein rich diet plan. Recommend tracking daily caloric intake using a calorie tracking app.  Aim for 1200 cal/day which should include about 90 g of protein daily  Behavioral Intervention  We discussed the following Behavioral Modification Strategies today: increasing lean protein intake to established goals, increasing fiber rich foods, increasing water intake , work on meal planning and preparation, work on Counselling psychologist calories using tracking application, keeping healthy foods at home, decreasing eating out or consumption of processed foods, and making healthy choices when eating convenient foods, continue to work on implementation of reduced calorie nutritional plan, planning for success, and continue to work on maintaining a reduced calorie state, getting the recommended amount of protein, incorporating whole foods, making healthy choices, staying well hydrated and practicing mindfulness when eating..  Additional resources provided today: NA  Recommended Physical Activity Goals  Kaitlin Meyers has been advised to work up to 150 minutes of moderate intensity aerobic activity a week and strengthening exercises 2-3 times per week for cardiovascular health, weight loss maintenance and preservation of muscle mass.   She has agreed to Increase the intensity, frequency or duration of aerobic exercises   Recommend 20 minutes of walking or exercise bike 5 days a  week  Pharmacotherapy changes for the treatment of obesity: None, consider use of metformin  ASSOCIATED CONDITIONS ADDRESSED TODAY  Low basal metabolic rate Initial basal metabolic rate was much lower than expected at 1051 cal/day at rest She has been working on improving her intake of lean protein at mealtime, increasing number of steps taken daily,  improving sleep at night.  Recommend tracking daily caloric intake aiming for 1200 cal/day which should include 90 g of protein daily.  Aim for 8 hours of sleep at night.  Increase exercise frequency to 20 minutes 5 days a week with plans to increase thereafter.  Repeat fasting IC in 2 to 3 months  Prediabetes She has run in the prediabetic range with findings of insulin resistance on labs.  She has been working on weight reduction, down and that 2 pounds in the past 2 months of medically supervised weight management.  She has never taken metformin.  She has been able to reduce her intake of starches and sweets over the past month and has plans to increase exercise frequency.  Information provided on metformin.  Will consider use if needed next visit.  Class 2 obesity due to excess calories with body mass index (BMI) of 39.0 to 39.9 in adult, unspecified whether serious comorbidity present  Low serum vitamin B12 Lab Results  Component Value Date   VITAMINB12 302 01/28/2023   She has run a low normal B12 level.  She has added in vitamin B12 500 mcg once daily.  Her energy level is starting to improve.  Repeat vitamin B12 level in 2 to 3 months  Sedentary lifestyle She has yet to add in regular exercise.  She is doing activities of daily living and was more active when caring for her grandkids for a week between visits.  She plans to do some more walking while at the beach on vacation in the spring and summer.  Barriers to progress have included back and knee pain, limited time due to caregiver stress.  We have discussed the importance of regular physical activity for not only weight reduction for cardiovascular health.  She agrees to adding in 20 minutes 5 days a week and either exercise bike or walking outdoors.    She was informed of the importance of frequent follow up visits to maximize her success with intensive lifestyle modifications for her multiple health  conditions.   ATTESTASTION STATEMENTS:  Reviewed by clinician on day of visit: allergies, medications, problem list, medical history, surgical history, family history, social history, and previous encounter notes pertinent to obesity diagnosis.   I have personally spent 30 minutes total time today in preparation, patient care, nutritional counseling and documentation for this visit, including the following: review of clinical lab tests; review of medical tests/procedures/services.      Glennis Brink, DO DABFM, DABOM [facility] [address] [place], [place] [address] [phone]

## 2023-04-10 ENCOUNTER — Encounter (HOSPITAL_COMMUNITY): Payer: Medicare Other

## 2023-04-14 ENCOUNTER — Encounter (HOSPITAL_COMMUNITY): Admission: RE | Payer: Self-pay | Source: Home / Self Care

## 2023-04-14 ENCOUNTER — Ambulatory Visit (HOSPITAL_COMMUNITY): Admission: RE | Admit: 2023-04-14 | Payer: Medicare Other | Source: Home / Self Care | Admitting: General Surgery

## 2023-04-14 DIAGNOSIS — D171 Benign lipomatous neoplasm of skin and subcutaneous tissue of trunk: Secondary | ICD-10-CM | POA: Diagnosis present

## 2023-04-14 SURGERY — EXCISION LIPOMA
Anesthesia: General

## 2023-05-06 ENCOUNTER — Ambulatory Visit: Payer: Medicare Other | Admitting: Family Medicine

## 2023-06-17 ENCOUNTER — Encounter: Payer: Self-pay | Admitting: Gastroenterology

## 2023-06-17 NOTE — Progress Notes (Signed)
 Referring Provider: Leesa Pulling, MD Primary Care Physician:  Leesa Pulling, MD Primary Gastroenterologist:  Dr. Mordechai April  Chief Complaint  Patient presents with   Constipation    Constipation and some nausea.  Bloating all the time.     HPI:   Kaitlin Meyers is a 68 y.o. female presenting today at the request of Leesa Pulling, MD for GERD and diarrhea.   Reviewed referral information.  Office visit with Dr. Bearl Limes on 05/23/23.  Patient reported a month of upset stomach, intermittent diarrhea, weakness.  Pain in the right upper quadrant over the last year.  Some bloating and some reflux.  Plan included checking for alpha gal, celiac disease, CBC, c-Met, lipase, abdominal ultrasound.  Also recommended patient keep a diary of when symptoms occur.  Labs completed 05/23/23: CBC within normal limits.  CMP within normal limits.  Alpha gal panel negative. Celiac screen negative. Lipase low at 13  Abdominal ultrasound 05/27/23: Mild fatty change in liver parenchyma, 2 areas of cystic formation as described similar to that noted on the earlier outside CT examination.  Areas of sludge suggested in some views of the gallbladder.  No gallstones, wall thickening, or pericholecystic fluid.  No biliary duct dilation.  Today: Had 3+ months of explosive diarrhea. No history of diarrhea prior. Got imodium and pepto. Took pepto once. Imodium cut in half and took total of 4 pills in the last 3 months. Diarrhea has stopped, but now having constipation. Tried dulcolax. Took 2 and only had a little result. Feels like she is full. This has been going on for over a week. Tried epsom salt, dulcolax chews, but no real result.   Felt some nausea while having diarrhea. Was told it was GERD. Never took anything. Hasn't had nausea as much with constipation but has it today. No vomiting. No abdominal pain. Never has.   No brbpr or melena.   Not eating much because she isn't having a BM.   Under a lot of  stress. Takes care of 85 y.o. mom who is demanding and 90y.o . dad who just had another stroke.   Before all of this, bowels have always been normal right after morning coffee.   Some intermittent upper abdominal bloating even prior to diarrhea.  Gets some nausea with this, so doesn't feel like eating. The only time she ever has reflux is if she eats something and goes straight to bed. Has early satiety.   Ibuprofen maybe 1 a month.   Colonoscopy at 49 and was normal. With [facility]. Has been dong cologuard and never had an issue.   Past Medical History:  Diagnosis Date   Anxiety    Arthritis    Atypical chest pain    Back pain    Complication of anesthesia ~1976   difficulty awaking   Depression    Edema    GERD (gastroesophageal reflux disease)    High blood pressure    Hx of migraines 01/15/2011   "I've had 2 migraines w/o pain in my life; vision issues"   Hyperlipidemia    Hypothyroidism    Joint pain    Palpitation    SOB (shortness of breath)    Vitamin D  deficiency     Past Surgical History:  Procedure Laterality Date   BLADDER SURGERY  2011   "mesh"   CERVICAL DISC SURGERY  2007   KNEE ARTHROSCOPY  01/2010   right   PARTIAL KNEE ARTHROPLASTY  01/14/2011   Procedure:  UNICOMPARTMENTAL KNEE;  Surgeon: Florencia Hunter, MD;  Location: [facility] OR;  Service: Orthopedics;  Laterality: Right;   REPLACEMENT UNICONDYLAR JOINT KNEE  01/14/11   "partial right medial replacement"   TEMPOROMANDIBULAR JOINT SURGERY  1987   VAGINAL HYSTERECTOMY  2011    Current Outpatient Medications  Medication Sig Dispense Refill   cetirizine HCl (ZYRTEC) 1 MG/ML solution Take 5 mg by mouth at bedtime.     Cholecalciferol (VITAMIN D3) 50 MCG (2000 UT) capsule Take 2,000 Units by mouth daily.     clonazePAM (KLONOPIN) 0.5 MG tablet Take 0.125 mg by mouth at bedtime.     cyanocobalamin  (VITAMIN B12) 500 MCG tablet Take 1 tablet (500 mcg total) by mouth daily. 30 tablet 0   DULoxetine (CYMBALTA) 20 MG  capsule Take 20 mg by mouth daily.     levothyroxine  (SYNTHROID , LEVOTHROID) 112 MCG tablet Take 112 mcg by mouth every morning.  0   No current facility-administered medications for this visit.    Allergies as of 06/18/2023 - Review Complete 06/18/2023  Allergen Reaction Noted   Sulfa antibiotics Nausea Only and Swelling 12/31/2010   Latex Hives 12/31/2010   Azithromycin Other (See Comments) 12/31/2010   Bupropion Other (See Comments) 03/15/2015   Other  10/22/2017   Penicillins Hives, Swelling, and Other (See Comments) 12/31/2010   Azo yeast plus [traumeel] Anxiety 05/03/2020   Peanut oil Palpitations 02/16/2019   Peanut-containing drug products Swelling and Palpitations 10/22/2017    Family History  Problem Relation Age of Onset   Hypertension Mother    Depression Mother    Thyroid disease Father    Stroke Father    Hyperlipidemia Father    Hypertension Father    Stroke Paternal Grandmother    Diabetes Paternal Grandmother    Arthritis Other    Diabetes Other     Social History   Socioeconomic History   Marital status: Married    Spouse name: Not on file   Number of children: Not on file   Years of education: Not on file   Highest education level: Not on file  Occupational History   Not on file  Tobacco Use   Smoking status: Never   Smokeless tobacco: Never  Vaping Use   Vaping status: Never Used  Substance and Sexual Activity   Alcohol use: Yes    Alcohol/week: 0.0 standard drinks of alcohol   Drug use: No   Sexual activity: Yes    Birth control/protection: Post-menopausal  Other Topics Concern   Not on file  Social History Narrative   Not on file   Social Drivers of Health   Financial Resource Strain: Not on file  Food Insecurity: Not on file  Transportation Needs: Not on file  Physical Activity: Not on file  Stress: Not on file  Social Connections: Unknown (06/22/2021)   Received from [facility]   Social Network    Social  Network: Not on file  Intimate Partner Violence: Unknown (05/15/2021)   Received from [facility], [facility]   HITS    Physically Hurt: Not on file    Insult or Talk Down To: Not on file    Threaten Physical Harm: Not on file    Scream or Curse: Not on file    Review of Systems: Gen: Denies any fever, chills, cold or flulike symptoms, presyncope, syncope. CV: Denies chest pain, heart palpitations. Resp: Denies shortness of breath, cough. GI: See HPI GU : Denies urinary burning, urinary frequency, urinary  hesitancy MS: Denies joint pain. Derm: Denies rash. Psych: Denies depression, anxiety. Heme: See HPI  Physical Exam: BP 136/81 (BP Location: Right Arm, Patient Position: Sitting, Cuff Size: Large)   Pulse 97   Temp (!) 96.9 F (36.1 C) (Temporal)   Ht 5\' 4"  (1.626 m)   Wt 225 lb 12.8 oz (102.4 kg)   BMI 38.76 kg/m  General:   Alert and oriented. Pleasant and cooperative. Well-nourished and well-developed.  Head:  Normocephalic and atraumatic. Eyes:  Without icterus, sclera clear and conjunctiva pink.  Ears:  Normal auditory acuity. Lungs:  Clear to auscultation bilaterally. No wheezes, rales, or rhonchi. No distress.  Heart:  S1, S2 present without murmurs appreciated.  Abdomen:  +BS, soft, non-tender and non-distended. No HSM noted. No guarding or rebound. No masses appreciated.  Rectal:  Deferred  Msk:  Symmetrical without gross deformities. Normal posture. Extremities:  Without edema. Neurologic:  Alert and  oriented x4;  grossly normal neurologically. Skin:  Intact without significant lesions or rashes. Psych:  Normal mood and affect.    Assessment:  68 year old female with history of anxiety, depression, HTN, HLD, hypothyroidism, presenting today for further evaluation of change in bowel habits, upper abdominal pain, nausea.  Change in bowel habits: Acute onset significant diarrhea fro normal BMs about 3 months ago that lasted until just recently when she  suddenly had changed from diarrhea to constipation.  She did take 4 half doses of Imodium in the last 3 months, but not routinely.  Denies associated abdominal pain or rectal bleeding.  She did have some increased nausea with diarrhea, but not as much now.  She has been under a lot of stress, but no other significant changes such as medications, activity level, diet, etc.  Recent laboratory evaluation 05/23/2023 showed CBC, CMP, alpha gal, celiac screen all normal.  Etiology of change in bowel habits is not clear.  Needs her colonoscopy updated to rule out underlying malignancy as her last colonoscopy was at age 8.  She reports having a normal exam at that time and has been doing Cologuard ever since with normal results.  Last Cologuard on file was August 2022 with negative result. No plans for abdominal imaging at this time as she has normal abdominal exam today and denies any pain, this doubt bowel obstruction.    Upper abdominal pain/nausea:  Intermittent upper abdominal pain/bloating with nausea, not specifically postprandial.  No routine reflux symptoms.  She does report some mild early satiety.  Recent labs 05/23/2023 with CBC, CMP, alpha-gal, celiac screen, lipase all normal/negative.  Abdominal ultrasound 05/27/2023 with mild fatty liver, 2 liver cyst, sludge in the gallbladder.  While upper GI symptoms could be biliary in nature, I recommended proceeding with an EGD to rule out gastritis, duodenitis, PUD, H. pylori.    Plan:  Proceed with upper endoscopy + colonoscopy with propofol  by Dr. Mordechai April in near future. The risks, benefits, and alternatives have been discussed with the patient in detail. The patient states understanding and desires to proceed.  ASA 2 Instructions given for MiraLAX bowel prep. After MiraLAX bowel prep, take MiraLAX 17 g daily to keep bowels moving. Requested patient let me know if constipation continued so we could try prescriptive agent such as Linzess.  Follow-up after  procedures.    Shana Daring, PA-C [facility] 06/18/2023

## 2023-06-17 NOTE — H&P (View-Only) (Signed)
 Referring Provider: Leesa Pulling, MD Primary Care Physician:  Leesa Pulling, MD Primary Gastroenterologist:  Dr. Mordechai April  Chief Complaint  Patient presents with   Constipation    Constipation and some nausea.  Bloating all the time.     HPI:   Kaitlin Meyers is a 68 y.o. female presenting today at the request of Leesa Pulling, MD for GERD and diarrhea.   Reviewed referral information.  Office visit with Dr. Bearl Limes on 05/23/23.  Patient reported a month of upset stomach, intermittent diarrhea, weakness.  Pain in the right upper quadrant over the last year.  Some bloating and some reflux.  Plan included checking for alpha gal, celiac disease, CBC, c-Met, lipase, abdominal ultrasound.  Also recommended patient keep a diary of when symptoms occur.  Labs completed 05/23/23: CBC within normal limits.  CMP within normal limits.  Alpha gal panel negative. Celiac screen negative. Lipase low at 13  Abdominal ultrasound 05/27/23: Mild fatty change in liver parenchyma, 2 areas of cystic formation as described similar to that noted on the earlier outside CT examination.  Areas of sludge suggested in some views of the gallbladder.  No gallstones, wall thickening, or pericholecystic fluid.  No biliary duct dilation.  Today: Had 3+ months of explosive diarrhea. No history of diarrhea prior. Got imodium and pepto. Took pepto once. Imodium cut in half and took total of 4 pills in the last 3 months. Diarrhea has stopped, but now having constipation. Tried dulcolax. Took 2 and only had a little result. Feels like she is full. This has been going on for over a week. Tried epsom salt, dulcolax chews, but no real result.   Felt some nausea while having diarrhea. Was told it was GERD. Never took anything. Hasn't had nausea as much with constipation but has it today. No vomiting. No abdominal pain. Never has.   No brbpr or melena.   Not eating much because she isn't having a BM.   Under a lot of  stress. Takes care of 85 y.o. mom who is demanding and 90y.o . dad who just had another stroke.   Before all of this, bowels have always been normal right after morning coffee.   Some intermittent upper abdominal bloating even prior to diarrhea.  Gets some nausea with this, so doesn't feel like eating. The only time she ever has reflux is if she eats something and goes straight to bed. Has early satiety.   Ibuprofen maybe 1 a month.   Colonoscopy at 49 and was normal. With [facility]. Has been dong cologuard and never had an issue.   Past Medical History:  Diagnosis Date   Anxiety    Arthritis    Atypical chest pain    Back pain    Complication of anesthesia ~1976   difficulty awaking   Depression    Edema    GERD (gastroesophageal reflux disease)    High blood pressure    Hx of migraines 01/15/2011   "I've had 2 migraines w/o pain in my life; vision issues"   Hyperlipidemia    Hypothyroidism    Joint pain    Palpitation    SOB (shortness of breath)    Vitamin D  deficiency     Past Surgical History:  Procedure Laterality Date   BLADDER SURGERY  2011   "mesh"   CERVICAL DISC SURGERY  2007   KNEE ARTHROSCOPY  01/2010   right   PARTIAL KNEE ARTHROPLASTY  01/14/2011   Procedure:  UNICOMPARTMENTAL KNEE;  Surgeon: Florencia Hunter, MD;  Location: [facility] OR;  Service: Orthopedics;  Laterality: Right;   REPLACEMENT UNICONDYLAR JOINT KNEE  01/14/11   "partial right medial replacement"   TEMPOROMANDIBULAR JOINT SURGERY  1987   VAGINAL HYSTERECTOMY  2011    Current Outpatient Medications  Medication Sig Dispense Refill   cetirizine HCl (ZYRTEC) 1 MG/ML solution Take 5 mg by mouth at bedtime.     Cholecalciferol (VITAMIN D3) 50 MCG (2000 UT) capsule Take 2,000 Units by mouth daily.     clonazePAM (KLONOPIN) 0.5 MG tablet Take 0.125 mg by mouth at bedtime.     cyanocobalamin  (VITAMIN B12) 500 MCG tablet Take 1 tablet (500 mcg total) by mouth daily. 30 tablet 0   DULoxetine (CYMBALTA) 20 MG  capsule Take 20 mg by mouth daily.     levothyroxine  (SYNTHROID , LEVOTHROID) 112 MCG tablet Take 112 mcg by mouth every morning.  0   No current facility-administered medications for this visit.    Allergies as of 06/18/2023 - Review Complete 06/18/2023  Allergen Reaction Noted   Sulfa antibiotics Nausea Only and Swelling 12/31/2010   Latex Hives 12/31/2010   Azithromycin Other (See Comments) 12/31/2010   Bupropion Other (See Comments) 03/15/2015   Other  10/22/2017   Penicillins Hives, Swelling, and Other (See Comments) 12/31/2010   Azo yeast plus [traumeel] Anxiety 05/03/2020   Peanut oil Palpitations 02/16/2019   Peanut-containing drug products Swelling and Palpitations 10/22/2017    Family History  Problem Relation Age of Onset   Hypertension Mother    Depression Mother    Thyroid disease Father    Stroke Father    Hyperlipidemia Father    Hypertension Father    Stroke Paternal Grandmother    Diabetes Paternal Grandmother    Arthritis Other    Diabetes Other     Social History   Socioeconomic History   Marital status: Married    Spouse name: Not on file   Number of children: Not on file   Years of education: Not on file   Highest education level: Not on file  Occupational History   Not on file  Tobacco Use   Smoking status: Never   Smokeless tobacco: Never  Vaping Use   Vaping status: Never Used  Substance and Sexual Activity   Alcohol use: Yes    Alcohol/week: 0.0 standard drinks of alcohol   Drug use: No   Sexual activity: Yes    Birth control/protection: Post-menopausal  Other Topics Concern   Not on file  Social History Narrative   Not on file   Social Drivers of Health   Financial Resource Strain: Not on file  Food Insecurity: Not on file  Transportation Needs: Not on file  Physical Activity: Not on file  Stress: Not on file  Social Connections: Unknown (06/22/2021)   Received from [facility], [facility]   Social Network    Social  Network: Not on file  Intimate Partner Violence: Unknown (05/15/2021)   Received from [facility], [facility]   HITS    Physically Hurt: Not on file    Insult or Talk Down To: Not on file    Threaten Physical Harm: Not on file    Scream or Curse: Not on file    Review of Systems: Gen: Denies any fever, chills, cold or flulike symptoms, presyncope, syncope. CV: Denies chest pain, heart palpitations. Resp: Denies shortness of breath, cough. GI: See HPI GU : Denies urinary burning, urinary frequency, urinary  hesitancy MS: Denies joint pain. Derm: Denies rash. Psych: Denies depression, anxiety. Heme: See HPI  Physical Exam: BP 136/81 (BP Location: Right Arm, Patient Position: Sitting, Cuff Size: Large)   Pulse 97   Temp (!) 96.9 F (36.1 C) (Temporal)   Ht 5\' 4"  (1.626 m)   Wt 225 lb 12.8 oz (102.4 kg)   BMI 38.76 kg/m  General:   Alert and oriented. Pleasant and cooperative. Well-nourished and well-developed.  Head:  Normocephalic and atraumatic. Eyes:  Without icterus, sclera clear and conjunctiva pink.  Ears:  Normal auditory acuity. Lungs:  Clear to auscultation bilaterally. No wheezes, rales, or rhonchi. No distress.  Heart:  S1, S2 present without murmurs appreciated.  Abdomen:  +BS, soft, non-tender and non-distended. No HSM noted. No guarding or rebound. No masses appreciated.  Rectal:  Deferred  Msk:  Symmetrical without gross deformities. Normal posture. Extremities:  Without edema. Neurologic:  Alert and  oriented x4;  grossly normal neurologically. Skin:  Intact without significant lesions or rashes. Psych:  Normal mood and affect.    Assessment:  68 year old female with history of anxiety, depression, HTN, HLD, hypothyroidism, presenting today for further evaluation of change in bowel habits, upper abdominal pain, nausea.  Change in bowel habits: Acute onset significant diarrhea fro normal BMs about 3 months ago that lasted until just recently when she  suddenly had changed from diarrhea to constipation.  She did take 4 half doses of Imodium in the last 3 months, but not routinely.  Denies associated abdominal pain or rectal bleeding.  She did have some increased nausea with diarrhea, but not as much now.  She has been under a lot of stress, but no other significant changes such as medications, activity level, diet, etc.  Recent laboratory evaluation 05/23/2023 showed CBC, CMP, alpha gal, celiac screen all normal.  Etiology of change in bowel habits is not clear.  Needs her colonoscopy updated to rule out underlying malignancy as her last colonoscopy was at age 8.  She reports having a normal exam at that time and has been doing Cologuard ever since with normal results.  Last Cologuard on file was August 2022 with negative result. No plans for abdominal imaging at this time as she has normal abdominal exam today and denies any pain, this doubt bowel obstruction.    Upper abdominal pain/nausea:  Intermittent upper abdominal pain/bloating with nausea, not specifically postprandial.  No routine reflux symptoms.  She does report some mild early satiety.  Recent labs 05/23/2023 with CBC, CMP, alpha-gal, celiac screen, lipase all normal/negative.  Abdominal ultrasound 05/27/2023 with mild fatty liver, 2 liver cyst, sludge in the gallbladder.  While upper GI symptoms could be biliary in nature, I recommended proceeding with an EGD to rule out gastritis, duodenitis, PUD, H. pylori.    Plan:  Proceed with upper endoscopy + colonoscopy with propofol  by Dr. Mordechai April in near future. The risks, benefits, and alternatives have been discussed with the patient in detail. The patient states understanding and desires to proceed.  ASA 2 Instructions given for MiraLAX bowel prep. After MiraLAX bowel prep, take MiraLAX 17 g daily to keep bowels moving. Requested patient let me know if constipation continued so we could try prescriptive agent such as Linzess.  Follow-up after  procedures.    Shana Daring, PA-C [facility] 06/18/2023

## 2023-06-18 ENCOUNTER — Encounter: Payer: Self-pay | Admitting: Gastroenterology

## 2023-06-18 ENCOUNTER — Ambulatory Visit (INDEPENDENT_AMBULATORY_CARE_PROVIDER_SITE_OTHER): Admitting: Gastroenterology

## 2023-06-18 ENCOUNTER — Encounter: Payer: Self-pay | Admitting: *Deleted

## 2023-06-18 ENCOUNTER — Other Ambulatory Visit: Payer: Self-pay | Admitting: *Deleted

## 2023-06-18 VITALS — BP 136/81 | HR 97 | Temp 96.9°F | Ht 64.0 in | Wt 225.8 lb

## 2023-06-18 DIAGNOSIS — R194 Change in bowel habit: Secondary | ICD-10-CM

## 2023-06-18 DIAGNOSIS — R1010 Upper abdominal pain, unspecified: Secondary | ICD-10-CM

## 2023-06-18 DIAGNOSIS — R110 Nausea: Secondary | ICD-10-CM | POA: Diagnosis not present

## 2023-06-18 DIAGNOSIS — K7689 Other specified diseases of liver: Secondary | ICD-10-CM

## 2023-06-18 DIAGNOSIS — K760 Fatty (change of) liver, not elsewhere classified: Secondary | ICD-10-CM

## 2023-06-18 MED ORDER — PEG 3350-KCL-NA BICARB-NACL 420 G PO SOLR: 4000.0000 mL | Freq: Once | ORAL | 0 refills | Status: AC

## 2023-06-18 NOTE — Patient Instructions (Addendum)
 We will get you scheduled for an upper endoscopy and colonoscopy with Dr. Mordechai April at [facility].   Start MiraLAX 17 g twice a day in 8 ounces of water or other noncarbonated beverage of your choice for constipation.  Over the weekend, complete MiraLAX bowel prep: MIRALAX PREP INSTRUCTION SHEET Purchase:  MIRALAX 238 gram bottle, 1 FLEET ENEMA, 1 box of DULCOLAX (All over the counter medications) CLEAR LIQUIDS ALL DAY  At 10:00 AM, take 2 DULCOLAX 5mg  tablets  At 12:00 PM, Mix 5 teaspoons of Miralax in any 4-6 ounces of CLEAR LIQUIDS (Gatorade) every hour for 5 hours until passing clear, watery stools. Be sure to drink 4 ounces of clear liquid 30 minutes after each dose of Miralax.   At 3:00 PM, take 2 Dulcolax 5mg  tablets  If stools are not frequent and watery 6:00 PM, take 5 teaspoons of Miralax every 30 minutes until stools become watery and starting to turn clear.     After completing MiraLAX bowel prep, resume taking MiraLAX daily to keep your bowels moving.  If this is not helpful, please let me know and we can try a prescriptive agent.    I will see you back in the office after your procedures.   Shana Daring, PA-C [facility]enter[facility]

## 2023-06-20 ENCOUNTER — Encounter: Payer: Self-pay | Admitting: Gastroenterology

## 2023-07-10 ENCOUNTER — Telehealth: Payer: Self-pay | Admitting: *Deleted

## 2023-07-10 NOTE — Telephone Encounter (Signed)
 Patient called in. Had several questions regarding bowel prep. Answered all. She was questioning if they were correct. I advised it is. She stated she will do the best she can.

## 2023-07-15 ENCOUNTER — Encounter (HOSPITAL_COMMUNITY): Payer: Self-pay | Admitting: Internal Medicine

## 2023-07-15 ENCOUNTER — Other Ambulatory Visit: Payer: Self-pay

## 2023-07-15 ENCOUNTER — Ambulatory Visit: Admitting: Family Medicine

## 2023-07-15 ENCOUNTER — Ambulatory Visit (HOSPITAL_COMMUNITY): Admitting: Anesthesiology

## 2023-07-15 ENCOUNTER — Ambulatory Visit (HOSPITAL_COMMUNITY)
Admission: RE | Admit: 2023-07-15 | Discharge: 2023-07-15 | Disposition: A | Discharge status: Home / Self Care | Attending: Internal Medicine | Admitting: Internal Medicine

## 2023-07-15 ENCOUNTER — Encounter (HOSPITAL_COMMUNITY)
Admission: RE | Disposition: A | Discharge status: Home / Self Care | Payer: Self-pay | Source: Home / Self Care | Attending: Internal Medicine

## 2023-07-15 DIAGNOSIS — K529 Noninfective gastroenteritis and colitis, unspecified: Secondary | ICD-10-CM | POA: Insufficient documentation

## 2023-07-15 DIAGNOSIS — Z79890 Hormone replacement therapy (postmenopausal): Secondary | ICD-10-CM | POA: Insufficient documentation

## 2023-07-15 DIAGNOSIS — E039 Hypothyroidism, unspecified: Secondary | ICD-10-CM | POA: Insufficient documentation

## 2023-07-15 DIAGNOSIS — K644 Residual hemorrhoidal skin tags: Secondary | ICD-10-CM

## 2023-07-15 DIAGNOSIS — K635 Polyp of colon: Secondary | ICD-10-CM

## 2023-07-15 DIAGNOSIS — R1013 Epigastric pain: Secondary | ICD-10-CM | POA: Diagnosis not present

## 2023-07-15 DIAGNOSIS — K2970 Gastritis, unspecified, without bleeding: Secondary | ICD-10-CM | POA: Diagnosis not present

## 2023-07-15 DIAGNOSIS — K2100 Gastro-esophageal reflux disease with esophagitis, without bleeding: Secondary | ICD-10-CM

## 2023-07-15 DIAGNOSIS — K648 Other hemorrhoids: Secondary | ICD-10-CM

## 2023-07-15 DIAGNOSIS — D122 Benign neoplasm of ascending colon: Secondary | ICD-10-CM

## 2023-07-15 DIAGNOSIS — K5730 Diverticulosis of large intestine without perforation or abscess without bleeding: Secondary | ICD-10-CM

## 2023-07-15 DIAGNOSIS — I10 Essential (primary) hypertension: Secondary | ICD-10-CM | POA: Insufficient documentation

## 2023-07-15 DIAGNOSIS — K2270 Barrett's esophagus without dysplasia: Secondary | ICD-10-CM

## 2023-07-15 DIAGNOSIS — D120 Benign neoplasm of cecum: Secondary | ICD-10-CM | POA: Diagnosis not present

## 2023-07-15 DIAGNOSIS — K2960 Other gastritis without bleeding: Secondary | ICD-10-CM | POA: Diagnosis not present

## 2023-07-15 DIAGNOSIS — F419 Anxiety disorder, unspecified: Secondary | ICD-10-CM | POA: Insufficient documentation

## 2023-07-15 DIAGNOSIS — F32A Depression, unspecified: Secondary | ICD-10-CM | POA: Insufficient documentation

## 2023-07-15 HISTORY — PX: COLONOSCOPY: SHX5424

## 2023-07-15 HISTORY — PX: ESOPHAGOGASTRODUODENOSCOPY: SHX5428

## 2023-07-15 SURGERY — COLONOSCOPY
Anesthesia: General

## 2023-07-15 MED ORDER — LACTATED RINGERS IV SOLN: INTRAVENOUS | Status: DC | PRN

## 2023-07-15 MED ORDER — PROPOFOL 10 MG/ML IV BOLUS
INTRAVENOUS | Status: DC | PRN
  Administered 2023-07-15: 20 mg via INTRAVENOUS
  Administered 2023-07-15: 100 mg via INTRAVENOUS

## 2023-07-15 MED ORDER — PANTOPRAZOLE SODIUM 40 MG PO TBEC: 40.0000 mg | DELAYED_RELEASE_TABLET | Freq: Two times a day (BID) | ORAL | 11 refills | Status: DC

## 2023-07-15 MED ORDER — LIDOCAINE 2% (20 MG/ML) 5 ML SYRINGE
INTRAMUSCULAR | Status: DC | PRN
  Administered 2023-07-15: 50 mg via INTRAVENOUS

## 2023-07-15 MED ORDER — PROPOFOL 500 MG/50ML IV EMUL
INTRAVENOUS | Status: DC | PRN
  Administered 2023-07-15: 150 ug/kg/min via INTRAVENOUS

## 2023-07-15 MED ORDER — LACTATED RINGERS IV SOLN: INTRAVENOUS | Status: DC

## 2023-07-15 NOTE — Discharge Instructions (Addendum)
 EGD Discharge instructions Please read the instructions outlined below and refer to this sheet in the next few weeks. These discharge instructions provide you with general information on caring for yourself after you leave the hospital. Your doctor may also give you specific instructions. While your treatment has been planned according to the most current medical practices available, unavoidable complications occasionally occur. If you have any problems or questions after discharge, please call your doctor. ACTIVITY You may resume your regular activity but move at a slower pace for the next 24 hours.  Take frequent rest periods for the next 24 hours.  Walking will help expel (get rid of) the air and reduce the bloated feeling in your abdomen.  No driving for 24 hours (because of the anesthesia (medicine) used during the test).  You may shower.  Do not sign any important legal documents or operate any machinery for 24 hours (because of the anesthesia used during the test).  NUTRITION Drink plenty of fluids.  You may resume your normal diet.  Begin with a light meal and progress to your normal diet.  Avoid alcoholic beverages for 24 hours or as instructed by your caregiver.  MEDICATIONS You may resume your normal medications unless your caregiver tells you otherwise.  WHAT YOU CAN EXPECT TODAY You may experience abdominal discomfort such as a feeling of fullness or "gas" pains.  FOLLOW-UP Your doctor will discuss the results of your test with you.  SEEK IMMEDIATE MEDICAL ATTENTION IF ANY OF THE FOLLOWING OCCUR: Excessive nausea (feeling sick to your stomach) and/or vomiting.  Severe abdominal pain and distention (swelling).  Trouble swallowing.  Temperature over 101 F (37.8 C).  Rectal bleeding or vomiting of blood.      Colonoscopy Discharge Instructions  Read the instructions outlined below and refer to this sheet in the next few weeks. These discharge instructions provide you  with general information on caring for yourself after you leave the hospital. Your doctor may also give you specific instructions. While your treatment has been planned according to the most current medical practices available, unavoidable complications occasionally occur.   ACTIVITY You may resume your regular activity, but move at a slower pace for the next 24 hours.  Take frequent rest periods for the next 24 hours.  Walking will help get rid of the air and reduce the bloated feeling in your belly (abdomen).  No driving for 24 hours (because of the medicine (anesthesia) used during the test).   Do not sign any important legal documents or operate any machinery for 24 hours (because of the anesthesia used during the test).  NUTRITION Drink plenty of fluids.  You may resume your normal diet as instructed by your doctor.  Begin with a light meal and progress to your normal diet. Heavy or fried foods are harder to digest and may make you feel sick to your stomach (nauseated).  Avoid alcoholic beverages for 24 hours or as instructed.  MEDICATIONS You may resume your normal medications unless your doctor tells you otherwise.  WHAT YOU CAN EXPECT TODAY Some feelings of bloating in the abdomen.  Passage of more gas than usual.  Spotting of blood in your stool or on the toilet paper.  IF YOU HAD POLYPS REMOVED DURING THE COLONOSCOPY: No aspirin  products for 7 days or as instructed.  No alcohol for 7 days or as instructed.  Eat a soft diet for the next 24 hours.  FINDING OUT THE RESULTS OF YOUR TEST Not all test results  are available during your visit. If your test results are not back during the visit, make an appointment with your caregiver to find out the results. Do not assume everything is normal if you have not heard from your caregiver or the medical facility. It is important for you to follow up on all of your test results.  SEEK IMMEDIATE MEDICAL ATTENTION IF: You have more than a  spotting of blood in your stool.  Your belly is swollen (abdominal distention).  You are nauseated or vomiting.  You have a temperature over 101.  You have abdominal pain or discomfort that is severe or gets worse throughout the day.   Your EGD revealed mild amount inflammation in your stomach.  I took biopsies of this to rule out infection with a bacteria called H. pylori.  He also had evidence of mild reflux esophagitis which I also sampled.  Small bowel biopsies taken as well to rule out celiac disease.  Ongoing to start you on a new medication for GERD.  I want you to take pantoprazole 40 mg twice daily for the next 12 weeks.  Limit NSAID use as best as you can.    If symptoms do not improve, we may need to consider general surgery referral for your gallbladder.  Your colonoscopy revealed 8 polyp(s) which I removed successfully. Await pathology results, my office will contact you. I recommend repeating colonoscopy in 3 years for surveillance purposes.   Overall, your colon appeared very healthy.  I did not see any active inflammation indicative of underlying inflammatory bowel disease such as Crohn's disease or ulcerative colitis throughout your colon. I took biopsies of your colon to further evaluate.    Await pathology results, my office will contact you.  You also have diverticulosis and internal hemorrhoids. I would recommend increasing fiber in your diet or adding OTC Benefiber/Metamucil. Be sure to drink at least 4 to 6 glasses of water daily. Follow-up with GI in 8 weeks   I hope you have a great rest of your week!  Rolando Cliche. Mordechai April, D.O. Gastroenterology and Hepatology [facility]

## 2023-07-15 NOTE — Interval H&P Note (Signed)
 History and Physical Interval Note:  07/15/2023 12:24 PM  Kaitlin Meyers  has presented today for surgery, with the diagnosis of change in bowel habit, nausea,upper abd pain.  The various methods of treatment have been discussed with the patient and family. After consideration of risks, benefits and other options for treatment, the patient has consented to  Procedure(s) with comments: COLONOSCOPY (N/A) - 1;00 pm, asa 2 EGD (ESOPHAGOGASTRODUODENOSCOPY) (N/A) as a surgical intervention.  The patient's history has been reviewed, patient examined, no change in status, stable for surgery.  I have reviewed the patient's chart and labs.  Questions were answered to the patient's satisfaction.     [name]

## 2023-07-15 NOTE — Op Note (Signed)
 [facility] Patient Name: Kaitlin Meyers Procedure Date: 07/15/2023 12:46 PM MRN: [id number] Date of Birth: Apr 11, 1955 Attending MD: Rolando Cliche. Mordechai April , [place], [id number] CSN: [id number] Age: 68 Admit Type: Outpatient Procedure:                Colonoscopy Indications:              Chronic diarrhea Providers:                Rolando Cliche. Mordechai April, DO, Troy Furnish. Museum/gallery exhibitions officer, Charity fundraiser,                            Wilfredo Hanly. Roberta Chin, Technician Referring MD:             Rolando Cliche. Mordechai April, DO Medicines:                See the Anesthesia note for documentation of the                            administered medications Complications:            No immediate complications. Estimated Blood Loss:     Estimated blood loss was minimal. Procedure:                Pre-Anesthesia Assessment:                           - The anesthesia plan was to use monitored                            anesthesia care (MAC).                           After obtaining informed consent, the colonoscope                            was passed under direct vision. Throughout the                            procedure, the patient's blood pressure, pulse, and                            oxygen saturations were monitored continuously. The                            [id number]-HQ190L ([id number]) scope was introduced through                            the anus and advanced to the the cecum, identified                            by appendiceal orifice and ileocecal valve. The                            colonoscopy was performed without difficulty. The                            patient  tolerated the procedure well. The quality                            of the bowel preparation was evaluated using the                            BBPS [facility] Bowel Preparation Scale) with scores                            of: Right Colon = 3, Transverse Colon = 3 and Left                            Colon = 3 (entire mucosa seen well with no residual                             staining, small fragments of stool or opaque                            liquid). The total BBPS score equals 9. Scope In: 12:48:47 PM Scope Out: 1:13:25 PM Scope Withdrawal Time: 0 hours 21 minutes 35 seconds  Total Procedure Duration: 0 hours 24 minutes 38 seconds  Findings:      Non-bleeding internal hemorrhoids were found during endoscopy.      A few small-mouthed diverticula were found in the sigmoid colon.      Eight sessile polyps were found in the ascending colon and cecum. The       polyps were 3 to 6 mm in size. These polyps were removed with a cold       snare. Resection and retrieval were complete.      Biopsies for histology were taken with a cold forceps from the ascending       colon, transverse colon and descending colon for evaluation of       microscopic colitis.      Hemorrhoids were found on perianal exam. Impression:               - Non-bleeding internal hemorrhoids.                           - Diverticulosis in the sigmoid colon.                           - Eight 3 to 6 mm polyps in the ascending colon and                            in the cecum, removed with a cold snare. Resected                            and retrieved.                           - Hemorrhoids found on perianal exam.                           - Biopsies were taken with a cold forceps from the  ascending colon, transverse colon and descending                            colon for evaluation of microscopic colitis. Moderate Sedation:      Per Anesthesia Care Recommendation:           - Patient has a contact number available for                            emergencies. The signs and symptoms of potential                            delayed complications were discussed with the                            patient. Return to normal activities tomorrow.                            Written discharge instructions were provided to the                            patient.                            - Resume previous diet.                           - Continue present medications.                           - Await pathology results.                           - Repeat colonoscopy in 3 years for surveillance.                           - Return to GI clinic in 8 weeks. Procedure Code(s):        --- Professional ---                           [phone], Colonoscopy, flexible; with removal of                            tumor(s), polyp(s), or other lesion(s) by snare                            technique                           45380, 59, Colonoscopy, flexible; with biopsy,                            single or multiple Diagnosis Code(s):        --- Professional ---                           K64.8, Other hemorrhoids  D12.2, Benign neoplasm of ascending colon                           D12.0, Benign neoplasm of cecum                           K52.9, Noninfective gastroenteritis and colitis,                            unspecified                           K57.30, Diverticulosis of large intestine without                            perforation or abscess without bleeding CPT copyright 2022 American Medical Association. All rights reserved. The codes documented in this report are preliminary and upon coder review may  be revised to meet current compliance requirements. Rolando Cliche. Mordechai April, DO Rolando Cliche. Mordechai April, DO 07/15/2023 1:18:04 PM This report has been signed electronically. Number of Addenda: 0

## 2023-07-15 NOTE — Transfer of Care (Addendum)
 Immediate Anesthesia Transfer of Care Note  Patient: Kaitlin Meyers  Procedure(s) Performed: COLONOSCOPY EGD (ESOPHAGOGASTRODUODENOSCOPY)  Patient Location: PACU and Endoscopy Unit  Anesthesia Type:General  Level of Consciousness: awake  Airway & Oxygen Therapy: Patient Spontanous Breathing  Post-op Assessment: Report given to RN  Post vital signs: Reviewed and stable  Last Vitals:  Vitals Value Taken Time  BP 114/62   Temp 36.9   Pulse 65   Resp 14   SpO2 94     Last Pain:  Vitals:   07/15/23 1234  TempSrc:   PainSc: 0-No pain      Patients Stated Pain Goal: 10 (07/15/23 1156)  Complications: No notable events documented.

## 2023-07-15 NOTE — Op Note (Signed)
 [facility] Patient Name: Kaitlin Meyers Procedure Date: 07/15/2023 12:26 PM MRN: [id number] Date of Birth: August 22, 1955 Attending MD: Rolando Cliche. Mordechai April , [place], [id number] CSN: [id number] Age: 68 Admit Type: Outpatient Procedure:                Upper GI endoscopy Indications:              Epigastric abdominal pain, Abdominal bloating,                            Diarrhea Providers:                Rolando Cliche. Mordechai April, DO, Troy Furnish. Museum/gallery exhibitions officer, Charity fundraiser,                            Wilfredo Hanly. Roberta Chin, Technician Referring MD:             Rolando Cliche. Mordechai April, DO Medicines:                See the Anesthesia note for documentation of the                            administered medications Complications:            No immediate complications. Estimated Blood Loss:     Estimated blood loss was minimal. Procedure:                Pre-Anesthesia Assessment:                           - The anesthesia plan was to use monitored                            anesthesia care (MAC).                           After obtaining informed consent, the endoscope was                            passed under direct vision. Throughout the                            procedure, the patient's blood pressure, pulse, and                            oxygen saturations were monitored continuously. The                            [id number]-H190 ([id number]) scope was introduced through the                            mouth, and advanced to the second part of duodenum.                            The upper GI endoscopy was accomplished without  difficulty. The patient tolerated the procedure                            well. Scope In: 12:39:38 PM Scope Out: 12:44:14 PM Total Procedure Duration: 0 hours 4 minutes 36 seconds  Findings:      LA Grade A (one or more mucosal breaks less than 5 mm, not extending       between tops of 2 mucosal folds) esophagitis with no bleeding was found       at the gastroesophageal  junction. Biopsies were taken with a cold       forceps for histology.      Patchy minimal inflammation characterized by erythema was found in the       gastric antrum. Biopsies were taken with a cold forceps for Helicobacter       pylori testing.      The duodenal bulb, first portion of the duodenum and second portion of       the duodenum were normal. Biopsies for histology were taken with a cold       forceps for evaluation of celiac disease. Impression:               - LA Grade A reflux esophagitis with no bleeding.                            Biopsied.                           - Gastritis. Biopsied.                           - Normal duodenal bulb, first portion of the                            duodenum and second portion of the duodenum.                            Biopsied. Moderate Sedation:      Per Anesthesia Care Recommendation:           - Patient has a contact number available for                            emergencies. The signs and symptoms of potential                            delayed complications were discussed with the                            patient. Return to normal activities tomorrow.                            Written discharge instructions were provided to the                            patient.                           - Resume previous  diet.                           - Continue present medications.                           - Await pathology results.                           - Use a proton pump inhibitor PO BID for 12 weeks.                           - Return to GI clinic in 8 weeks.                           - If symptoms not improved on aggressive PPi                            therapy, consider surgical referral for biliary                            colic/gallbladder sludge Procedure Code(s):        --- Professional ---                           [phone], Esophagogastroduodenoscopy, flexible,                            transoral; with biopsy, single  or multiple Diagnosis Code(s):        --- Professional ---                           K21.00, Gastro-esophageal reflux disease with                            esophagitis, without bleeding                           K29.70, Gastritis, unspecified, without bleeding                           R10.13, Epigastric pain                           R14.0, Abdominal distension (gaseous)                           R19.7, Diarrhea, unspecified CPT copyright 2022 American Medical Association. All rights reserved. The codes documented in this report are preliminary and upon coder review may  be revised to meet current compliance requirements. Rolando Cliche. Mordechai April, DO Rolando Cliche. Mordechai April, DO 07/15/2023 12:47:56 PM This report has been signed electronically. Number of Addenda: 0

## 2023-07-15 NOTE — Anesthesia Preprocedure Evaluation (Signed)
 Anesthesia Evaluation  Patient identified by MRN, date of birth, ID band Patient awake    Reviewed: Allergy  & Precautions, H&P , NPO status , Patient's Chart, lab work & pertinent test results, reviewed documented beta blocker date and time   History of Anesthesia Complications (+) history of anesthetic complications  Airway Mallampati: II  TM Distance: >3 FB Neck ROM: full    Dental no notable dental hx.    Pulmonary neg pulmonary ROS   Pulmonary exam normal breath sounds clear to auscultation       Cardiovascular Exercise Tolerance: Good hypertension,  Rhythm:regular Rate:Normal     Neuro/Psych  PSYCHIATRIC DISORDERS Anxiety Depression    negative neurological ROS     GI/Hepatic Neg liver ROS,GERD  ,,  Endo/Other  Hypothyroidism    Renal/GU negative Renal ROS  negative genitourinary   Musculoskeletal   Abdominal   Peds  Hematology negative hematology ROS (+)   Anesthesia Other Findings   Reproductive/Obstetrics negative OB ROS                             Anesthesia Physical Anesthesia Plan  ASA: 2  Anesthesia Plan: General   Post-op Pain Management:    Induction:   PONV Risk Score and Plan: Propofol  infusion  Airway Management Planned:   Additional Equipment:   Intra-op Plan:   Post-operative Plan:   Informed Consent: I have reviewed the patients History and Physical, chart, labs and discussed the procedure including the risks, benefits and alternatives for the proposed anesthesia with the patient or authorized representative who has indicated his/her understanding and acceptance.     Dental Advisory Given  Plan Discussed with: CRNA  Anesthesia Plan Comments:        Anesthesia Quick Evaluation

## 2023-07-15 NOTE — Anesthesia Postprocedure Evaluation (Signed)
 Anesthesia Post Note  Patient: Kaitlin Meyers  Procedure(s) Performed: COLONOSCOPY EGD (ESOPHAGOGASTRODUODENOSCOPY)  Patient location during evaluation: Short Stay Anesthesia Type: General Level of consciousness: awake and alert Pain management: pain level controlled Vital Signs Assessment: post-procedure vital signs reviewed and stable Respiratory status: spontaneous breathing Cardiovascular status: stable Postop Assessment: no apparent nausea or vomiting Anesthetic complications: no   No notable events documented.   Last Vitals:  Vitals:   07/15/23 1205 07/15/23 1320  BP: (!) 146/81 114/62  Pulse:  65  Resp:  14  Temp:  36.9 C  SpO2:  94%    Last Pain:  Vitals:   07/15/23 1320  TempSrc: Axillary  PainSc: 0-No pain                 Ricci Paff

## 2023-07-16 ENCOUNTER — Encounter (HOSPITAL_COMMUNITY): Payer: Self-pay | Admitting: Internal Medicine

## 2023-07-16 LAB — SURGICAL PATHOLOGY

## 2023-07-17 ENCOUNTER — Ambulatory Visit: Payer: Self-pay | Admitting: Internal Medicine

## 2023-07-29 ENCOUNTER — Encounter: Payer: Self-pay | Admitting: General Surgery

## 2023-07-29 ENCOUNTER — Ambulatory Visit (INDEPENDENT_AMBULATORY_CARE_PROVIDER_SITE_OTHER): Admitting: General Surgery

## 2023-07-29 VITALS — BP 143/81 | HR 78 | Temp 98.3°F | Resp 14 | Ht 64.0 in | Wt 222.0 lb

## 2023-07-29 DIAGNOSIS — D171 Benign lipomatous neoplasm of skin and subcutaneous tissue of trunk: Secondary | ICD-10-CM | POA: Diagnosis not present

## 2023-07-29 NOTE — Patient Instructions (Signed)
 Will see you the day of your procedure to update your History and Physical.

## 2023-07-29 NOTE — Progress Notes (Signed)
 [facility] History and Physical  Reason for Referral: Lipoma shoulder    Chief Complaint   Follow-up     Kaitlin Meyers is a 68 y.o. female.  HPI:   Kaitlin Meyers is known to me with a 9.5cm lipoma on her right shoulder/ upper posterior back. Imaging confirmed this is likely a lipoma. She wants to proceed with excision. She is going on vacation in August.  She as noticed no further growth. It causes her some soreness/ discomfort at times.   Past Medical History:  Diagnosis Date   Anxiety    Arthritis    Atypical chest pain    Back pain    Complication of anesthesia ~1976   difficulty awaking   Depression    Edema    GERD (gastroesophageal reflux disease)    High blood pressure    Hx of migraines 01/15/2011   I've had 2 migraines w/o pain in my life; vision issues   Hyperlipidemia    Hypothyroidism    Joint pain    Palpitation    SOB (shortness of breath)    Vitamin D  deficiency     Past Surgical History:  Procedure Laterality Date   BLADDER SURGERY  2011   mesh   CERVICAL DISC SURGERY  2007   COLONOSCOPY N/A 07/15/2023   Procedure: COLONOSCOPY;  Surgeon: [name], DO;  [facility];  Service: Endoscopy;  Laterality: N/A;  1;00 pm, asa 2   ESOPHAGOGASTRODUODENOSCOPY N/A 07/15/2023   Procedure: EGD (ESOPHAGOGASTRODUODENOSCOPY);  Surgeon: [name], DO;  [facility];  Service: Endoscopy;  Laterality: N/A;   KNEE ARTHROSCOPY  01/2010   right   PARTIAL KNEE ARTHROPLASTY  01/14/2011   Procedure: UNICOMPARTMENTAL KNEE;  Surgeon: Florencia Hunter, MD;  Location: [facility];  Service: Orthopedics;  Laterality: Right;   REPLACEMENT UNICONDYLAR JOINT KNEE  01/14/11   partial right medial replacement   TEMPOROMANDIBULAR JOINT SURGERY  1987   VAGINAL HYSTERECTOMY  2011    Family History  Problem Relation Age of Onset   Hypertension Mother    Depression Mother    Thyroid disease Father    Stroke Father     Hyperlipidemia Father    Hypertension Father    Stroke Paternal Grandmother    Diabetes Paternal Grandmother    Arthritis Other    Diabetes Other     Social History   Tobacco Use   Smoking status: Never   Smokeless tobacco: Never  Vaping Use   Vaping status: Never Used  Substance Use Topics   Alcohol use: Yes    Alcohol/week: 0.0 standard drinks of alcohol   Drug use: No    Medications: I have reviewed the patient's current medications. Allergies as of 07/29/2023       Reactions   Sulfa Antibiotics Nausea Only, Swelling   Latex Hives   welps   Azithromycin Other (See Comments)   Pt states medication makes her have hallucination   Bupropion Other (See Comments)   Unknown reaction    Other    Pt reports not being able to take multiple pain medications causes blood pressure to drop. Unsure of drug names.    Penicillins Hives, Swelling, Other (See Comments)   Azo Yeast Plus [traumeel] Anxiety   Peanut Oil Palpitations   Peanut-containing Drug Products Swelling, Palpitations   peanuts        Medication List        Accurate as of July 29, 2023 11:48  AM. If you have any questions, ask your nurse or doctor.          cetirizine HCl 1 MG/ML solution Commonly known as: ZYRTEC Take 5 mg by mouth at bedtime.   clonazePAM 0.5 MG tablet Commonly known as: KLONOPIN Take 0.125 mg by mouth at bedtime.   cyanocobalamin  500 MCG tablet Commonly known as: VITAMIN B12 Take 1 tablet (500 mcg total) by mouth daily.   DULoxetine 20 MG capsule Commonly known as: CYMBALTA Take 20 mg by mouth daily.   levothyroxine  112 MCG tablet Commonly known as: SYNTHROID  Take 112 mcg by mouth every morning.   pantoprazole  40 MG tablet Commonly known as: Protonix  Take 1 tablet (40 mg total) by mouth 2 (two) times daily.   Vitamin D3 50 MCG (2000 UT) capsule Take 2,000 Units by mouth daily.         ROS:  A comprehensive review of systems was negative except for:  Musculoskeletal: positive for back/ neck pain soreness  Blood pressure (!) 143/81, pulse 78, temperature 98.3 F (36.8 C), temperature source Oral, resp. rate 14, height 5' 4 (1.626 m), weight 222 lb (100.7 kg), SpO2 94%. Physical Exam Vitals reviewed.  HENT:     Mouth/Throat:     Mouth: Mucous membranes are moist.   Eyes:     Pupils: Pupils are equal, round, and reactive to light.   Neck:     Comments: Right shoulder/ upper right back with 9-10cm lipoma Cardiovascular:     Rate and Rhythm: Normal rate.  Pulmonary:     Effort: Pulmonary effort is normal.  Abdominal:     General: There is no distension.     Palpations: Abdomen is soft.   Skin:    General: Skin is warm.   Neurological:     General: No focal deficit present.     Mental Status: She is alert.   Psychiatric:        Mood and Affect: Mood normal.     Results:  PROCEDURE: US  SOFT TISSUE   HISTORY: Patient is a 68 y/o F with back mass x 30-40 years (right upper back mass possible lipoma).   COMPARISON: .   TECHNIQUE: Two-dimensional grayscale and color Doppler ultrasound of the of the right upper back region of interest was performed.   FINDINGS: There is a 9.5 x 2.4 x 7.9 cm isoechoic and avascular lesion with ill-defined borders visualized identified within the right upper back, area of concern. No focal fluid collections are demonstrated.   IMPRESSION: 1. Right upper back 9.5 cm isoechoic soft tissue lesion, likely representing a benign lipoma.   Thank you for allowing us  to assist in the care of this patient.     Electronically Signed   By: Beula Brunswick M.D.   On: 01/11/2023 08:53  Assessment and Plan:  Kaitlin Meyers is a 68 y.o. female with known lipoma of the right shoulder/ upper back on right.   Excision planned for September after her vacation. Discussed risk of bleeding, infection, seroma, needing sutures that stay in place, not being able to submerge in water for 4 weeks or so  after surgery.   Will update H&P the day of surgery.   All questions were answered to the satisfaction of the patient.  Awilda Bogus 07/29/2023, 11:48 AM

## 2023-09-09 NOTE — Progress Notes (Unsigned)
 Referring Provider: Toribio Jerel MATSU, MD Primary Care Physician:  Toribio Jerel MATSU, MD Primary GI Physician: Dr. Cindie  Chief Complaint  Patient presents with   Follow-up    Follow up. No problems     HPI:   Kaitlin Meyers is a 68 y.o. female with history of anxiety, depression, HTN, HLD, hypothyroidism, presenting today for follow-up of change in bowel habits, upper abdominal pain, nausea.  Last seen in the office at the time of initial consult 06/18/2023.  Reported acute onset diarrhea from normal bowel movements 3 months ago that lasted until just recently when she suddenly developed constipation. Recent laboratory evaluation 05/23/2023 showed CBC, CMP, alpha gal, celiac screen all normal.  Recommended MiraLAX bowel prep, then take MiraLAX daily to keep bowels moving.  Also recommended colonoscopy.  She also reported intermittent upper abdominal pain/bloating with nausea, but not specifically postprandial.  Also with mild early satiety.  Denied reflux. Abdominal ultrasound 05/27/2023 with mild fatty liver, 2 liver cyst, sludge in the gallbladder.  Consider biliary etiology, but recommended EGD to rule out gastritis, duodenitis, PUD, H. pylori.   Colonoscopy 07/15/2023: - Non- bleeding internal hemorrhoids. - Diverticulosis in the sigmoid colon. - Eight 3 to 6 mm polyps in the ascending colon and in the cecum, removed with a cold snare. Resected and retrieved. - Hemorrhoids found on perianal exam. - Biopsies were taken with a cold forceps from the ascending colon, transverse colon and descending colon for evaluation of microscopic colitis. - Pathology with tubular adenomas, sessile serrated adenomas, hyperplastic polyps.  Random colon biopsies were benign. -Recommended repeat colonoscopy in 3 years.  EGD 07/15/23: - LA Grade A reflux esophagitis with no bleeding. Biopsied. - Gastritis. Biopsied. - Normal duodenal bulb, first portion of the duodenum and second portion of the duodenum.  Biopsied. - Recommended PPI twice daily x 12 weeks.  If symptoms not improved on aggressive PPI therapy, consider referral to surgery for biliary colic/gallbladder sludge. -Duodenal biopsy benign.  Gastric biopsy with reactive gastropathy, negative for H. pylori.  GE junction with reflux gastroesophagitis and intestinal metaplasia, negative for dysplasia. -Recommended EGD in 5 years.  Today:  Changed her diet and essentially all of her symptoms improved. Also lost 13 lbs with this. She has been under a lot of stress recently taking care of her parents and slacking on her diet, eating out more, so some diarrhea has returned. Was doing high protein and low carb. States she had never felt better. Had so much energy.   Diarrhea about 3 days a week. Greens and sweets will cause her diarrhea. Sometimes with abdominal cramping, but not usually. No brbpr or melena.   Never started pantoprazole . Was concerned about this as she read package insert regarding side effects and warning to take lowest possible dose.   In general, prefers not to take medication if she does not have to.   Past Medical History:  Diagnosis Date   Anxiety    Arthritis    Atypical chest pain    Back pain    Complication of anesthesia ~1976   difficulty awaking   Depression    Edema    GERD (gastroesophageal reflux disease)    High blood pressure    Hx of migraines 01/15/2011   I've had 2 migraines w/o pain in my life; vision issues   Hyperlipidemia    Hypothyroidism    Joint pain    Palpitation    SOB (shortness of breath)    Vitamin D   deficiency     Past Surgical History:  Procedure Laterality Date   BLADDER SURGERY  2011   mesh   CERVICAL DISC SURGERY  2007   COLONOSCOPY N/A 07/15/2023   Procedure: COLONOSCOPY;  Surgeon: Cindie Carlin POUR, DO;  [facility];  Service: Endoscopy;  Laterality: N/A;  1;00 pm, asa 2   ESOPHAGOGASTRODUODENOSCOPY N/A 07/15/2023   Procedure: EGD  (ESOPHAGOGASTRODUODENOSCOPY);  Surgeon: Cindie Carlin POUR, DO;  [facility]: [facility];  Service: Endoscopy;  Laterality: N/A;   KNEE ARTHROSCOPY  01/2010   right   PARTIAL KNEE ARTHROPLASTY  01/14/2011   Procedure: UNICOMPARTMENTAL KNEE;  Surgeon: Garnette JONETTA Raman, MD;  Location: [facility];  Service: Orthopedics;  Laterality: Right;   REPLACEMENT UNICONDYLAR JOINT KNEE  01/14/11   partial right medial replacement   TEMPOROMANDIBULAR JOINT SURGERY  1987   VAGINAL HYSTERECTOMY  2011    Current Outpatient Medications  Medication Sig Dispense Refill   cetirizine HCl (ZYRTEC) 1 MG/ML solution Take 5 mg by mouth at bedtime.     Cholecalciferol (VITAMIN D3) 50 MCG (2000 UT) capsule Take 2,000 Units by mouth daily.     clonazePAM (KLONOPIN) 0.5 MG tablet Take 0.125 mg by mouth at bedtime.     DULoxetine (CYMBALTA) 20 MG capsule Take 20 mg by mouth daily.     levothyroxine  (SYNTHROID , LEVOTHROID) 112 MCG tablet Take 112 mcg by mouth every morning.  0   pantoprazole  (PROTONIX ) 40 MG tablet Take 1 tablet (40 mg total) by mouth daily before breakfast. 30 tablet 3   No current facility-administered medications for this visit.    Allergies as of 09/10/2023 - Review Complete 09/10/2023  Allergen Reaction Noted   Sulfa antibiotics Nausea Only and Swelling 12/31/2010   Latex Hives 12/31/2010   Azithromycin Other (See Comments) 12/31/2010   Bupropion Other (See Comments) 03/15/2015   Other  10/22/2017   Penicillins Hives, Swelling, and Other (See Comments) 12/31/2010   Azo yeast plus [traumeel] Anxiety 05/03/2020   Peanut oil Palpitations 02/16/2019   Peanut-containing drug products Swelling and Palpitations 10/22/2017    Family History  Problem Relation Age of Onset   Hypertension Mother    Depression Mother    Thyroid disease Father    Stroke Father    Hyperlipidemia Father    Hypertension Father    Stroke Paternal Grandmother    Diabetes Paternal Grandmother    Arthritis Other     Diabetes Other     Social History   Socioeconomic History   Marital status: Married    Spouse name: Not on file   Number of children: Not on file   Years of education: Not on file   Highest education level: Not on file  Occupational History   Not on file  Tobacco Use   Smoking status: Never   Smokeless tobacco: Never  Vaping Use   Vaping status: Never Used  Substance and Sexual Activity   Alcohol use: Yes    Alcohol/week: 0.0 standard drinks of alcohol   Drug use: No   Sexual activity: Yes    Birth control/protection: Post-menopausal  Other Topics Concern   Not on file  Social History Narrative   Not on file   Social Drivers of Health   Financial Resource Strain: Not on file  Food Insecurity: Not on file  Transportation Needs: Not on file  Physical Activity: Not on file  Stress: Not on file  Social Connections: Unknown (06/22/2021)   Received from [facility]  Social Network    Social Network: Not on file    Review of Systems: Gen: Denies fever, chills, cold or flulike symptoms, presyncope, syncope. GI: See HPI Heme: See HPI  Physical Exam: BP 131/83 (BP Location: Left Arm, Patient Position: Sitting, Cuff Size: Large)   Pulse (!) 102   Temp 98 F (36.7 C) (Temporal)   Ht 5' 4 (1.626 m)   Wt 223 lb 3.2 oz (101.2 kg)   BMI 38.31 kg/m  General:   Alert and oriented. No distress noted. Pleasant and cooperative.  Head:  Normocephalic and atraumatic. Eyes:  Conjuctiva clear without scleral icterus. Heart:  S1, S2 present without murmurs appreciated. Lungs:  Clear to auscultation bilaterally. No wheezes, rales, or rhonchi. No distress.  Abdomen:  +BS, soft, non-tender and non-distended. No rebound or guarding. No HSM or masses noted. Msk:  Symmetrical without gross deformities. Normal posture. Extremities:  Without edema. Neurologic:  Alert and  oriented x4 Psych:  Normal mood and affect.    Assessment:  68 y.o. female with history of anxiety,  depression, HTN, HLD, hypothyroidism, presenting today for follow-up of change in bowel habits, upper abdominal pain, nausea.   Change in bowel habits/intermittent diarrhea: Acute onset diarrhea in February 2025, then developed mild constipation in May 2025.  Reports significant dietary changes, following high-protein, low-carb diet with complete normalization of her bowels until recently when she was put under more stress and has not been a strict with her diet.  It is possible that she had an acute viral gastroenteritis that caused her initial diarrhea and has been left with some postinfectious IBS/food intolerances.  As she does mention sweets being a trigger, query sucrase deficiency.  Prior evaluation with CBC, CMP, alpha gal, celiac screen all normal.  Colonoscopy in June 2025 with several polyps removed, but nothing to explain diarrhea and colon biopsies benign.  Barrett's esophagus: Barrett's esophagus without dysplasia noted on EGD 07/15/2023.  Patient not currently taking PPI as she was concerned after she read the package insert. We discussed Barrett's esophagus being a precancerous condition and the importance of taking daily PPI to minimize risk of progression to malignancy.  She is agreeable to starting daily PPI.  GERD: Grade a reflux esophagitis noted on EGD in June 2025.  Previously reporting upper abdominal pain/bloating with nausea which was likely secondary to GERD and esophagitis.  She reports resolution of all symptoms after significant dietary changes and is not currently taking a PPI; however, due to Barrett's esophagus also noted on EGD, will resume daily PPI.   Plan:  Attempt to obtain free for a trial of Sucraid Limit known dietary triggers of diarrhea (sweets and greens) Okay to use Imodium as needed. Start pantoprazole  40 mg daily, 30 minutes for breakfast. Follow-up in 3 months or sooner if needed.    Josette Centers, PA-C [facility] 09/10/2023

## 2023-09-10 ENCOUNTER — Encounter: Payer: Self-pay | Admitting: Gastroenterology

## 2023-09-10 ENCOUNTER — Ambulatory Visit (INDEPENDENT_AMBULATORY_CARE_PROVIDER_SITE_OTHER): Admitting: Gastroenterology

## 2023-09-10 VITALS — BP 131/83 | HR 102 | Temp 98.0°F | Ht 64.0 in | Wt 223.2 lb

## 2023-09-10 DIAGNOSIS — K219 Gastro-esophageal reflux disease without esophagitis: Secondary | ICD-10-CM | POA: Diagnosis not present

## 2023-09-10 DIAGNOSIS — R197 Diarrhea, unspecified: Secondary | ICD-10-CM

## 2023-09-10 DIAGNOSIS — K2270 Barrett's esophagus without dysplasia: Secondary | ICD-10-CM | POA: Diagnosis not present

## 2023-09-10 DIAGNOSIS — Z860100 Personal history of colon polyps, unspecified: Secondary | ICD-10-CM

## 2023-09-10 DIAGNOSIS — K2100 Gastro-esophageal reflux disease with esophagitis, without bleeding: Secondary | ICD-10-CM

## 2023-09-10 MED ORDER — PANTOPRAZOLE SODIUM 40 MG PO TBEC: 40.0000 mg | DELAYED_RELEASE_TABLET | Freq: Every day | ORAL | 3 refills | Status: DC

## 2023-09-10 NOTE — Patient Instructions (Addendum)
 We will submit for a trial of sucraid to see if this will help with your diarrhea.   Try to limit known dietary triggers of your diarrhea.   You can take imodium as needed.   I would like for you to start Pantoprazole  40 mg daily, 30 minutes before breakfast. This is due to Barrett's esophagus.   We will see you back in 3 months or sooner if needed.   Josette Centers, PA-C [facility]

## 2023-09-11 ENCOUNTER — Telehealth: Payer: Self-pay | Admitting: *Deleted

## 2023-09-11 NOTE — Telephone Encounter (Signed)
 Called pt back several times left a message for her to return my call.

## 2023-09-11 NOTE — Telephone Encounter (Signed)
 Per Charmaine Patch, CMA, this was incorrect entry.

## 2023-09-12 ENCOUNTER — Telehealth: Payer: Self-pay | Admitting: *Deleted

## 2023-09-12 NOTE — Telephone Encounter (Signed)
 Received a denial letter for [employer]. Scanned a copy in chart.

## 2023-09-14 NOTE — Telephone Encounter (Signed)
 Noted. Please let patient know.

## 2023-09-23 NOTE — Telephone Encounter (Signed)
 LMOM for pt to call office

## 2023-10-08 NOTE — Telephone Encounter (Signed)
 Spoke to pt, informed her of recommendations.

## 2023-10-10 NOTE — Patient Instructions (Signed)
 Kaitlin Meyers  10/10/2023     @PREFPERIOPPHARMACY @   Your procedure is scheduled on  10/17/2023.   Report to [facility] at  0600  A.M.   Call this number if you have problems the morning of surgery:  [phone]  If you experience any cold or flu symptoms such as cough, fever, chills, shortness of breath, etc. between now and your scheduled surgery, please notify us  at the above number.   Remember:  Do not eat after midnight.   You may drink clear liquids until 0330 am on 10/17/2023.    Clear liquids allowed are:                    Water, Juice (No red color; non-citric and without pulp; diabetics please choose diet or no sugar options), Carbonated beverages (diabetics please choose diet or no sugar options), Clear Tea (No creamer, milk, or cream, including half & half and powdered creamer), Black Coffee Only (No creamer, milk or cream, including half & half and powdered creamer), and Clear Sports drink (No red color; diabetics please choose diet or no sugar options)    Take these medicines the morning of surgery with A SIP OF WATER                       duloxetine, levothyroxine , pantoprazole .    Do not wear jewelry, make-up or nail polish, including gel polish,  artificial nails, or any other type of covering on natural nails (fingers and  toes).  Do not wear lotions, powders, or perfumes, or deodorant.  Do not shave 48 hours prior to surgery.  Men may shave face and neck.  Do not bring valuables to the hospital.  [facility] is not responsible for any belongings or valuables.  Contacts, dentures or bridgework may not be worn into surgery.  Leave your suitcase in the car.  After surgery it may be brought to your room.  For patients admitted to the hospital, discharge time will be determined by your treatment team.  Patients discharged the day of surgery will not be allowed to drive home and must have someone with them for 24 hours.    Special instructions:    DO NOT smoke tobacco or vape for 24 hours before your procedure.  Please read over the following fact sheets that you were given. Coughing and Deep Breathing, Surgical Site Infection Prevention, Anesthesia Post-op Instructions, and Care and Recovery After Surgery      Incision Care, Adult An incision is a cut that a doctor makes in your skin for surgery. Most times, these cuts are closed after surgery. Your cut from surgery may be closed with: Stitches (sutures). Staples. Skin glue. Skin tape (adhesive) strips. You may need to go back to your doctor to have stitches or staples taken out. This may happen many days or many weeks after your surgery. You need to take good care of your cut so it does not get infected. Follow instructions from your doctor about how to care for your cut. Supplies needed: Soap and water. A clean hand towel. Wound cleanser. A clean bandage (dressing), if needed. Cream or ointment, if told by your doctor. Clean gauze. How to care for your cut from surgery Cleaning your cut Ask your doctor how to clean your cut. You may need to: Wear medical gloves. Use mild soap and water, or a wound cleanser. Use a clean gauze  to pat your cut dry after you clean it. Changing your bandage Wash your hands with soap and water for at least 20 seconds before and after you change your bandage. If you cannot use soap and water, use hand sanitizer. Do not usedisinfectants or antiseptics, such as rubbing alcohol, to clean your wound unless told by your doctor. Change your bandage as told by your doctor. Leavestitches or skin glue in place for at least 2 weeks. Leave tape strips alone unless you are told to take them off. You may trim the edges of the tape strips if they curl up. Put a cream or ointment on your cut. Do this only as told. Cover your cut with a clean bandage. Ask your doctor when you can leave your cut uncovered. Checking for infection Check your cut area every day  for signs of infection. Check for: More redness, swelling, or pain. More fluid or blood. New warmth. Hardness or a new rash around the incision. Pus or a bad smell.  Follow these instructions at home Medicines Take over-the-counter and prescription medicines only as told by your doctor. If you were prescribed an antibiotic medicine, cream, or ointment, use it as told by your doctor. Do not stop using the antibiotic even if you start to feel better. Eating and drinking Eat foods that have a lot of certain nutrients, such as protein, vitamin A, and vitamin C. These foods help your cut heal. Foods rich in protein include meat, fish, eggs, dairy, beans, nuts, and protein drinks. Foods rich in vitamin A include carrots and dark green, leafy vegetables. Foods rich in vitamin C include citrus fruits, tomatoes, broccoli, and peppers. Drink enough fluid to keep your pee (urine) pale yellow. General instructions  Do not take baths, swim, or use a hot tub. Ask your doctor about taking showers or sponge baths. Limit movement around your cut. This helps with healing. Try not to strain, lift, or exercise for the first 2 weeks, or for as long as told by your doctor. Return to your normal activities as told by your doctor. Ask your doctor what activities are safe for you. Do not scratch, scrub, or pick at your cut. Keep it covered as told by your doctor. Protect your cut from the sun when you are outside for the first 6 months, or for as long as told by your doctor. Cover up the scar area or put on sunscreen that has an SPF of at least 30. Do not use any products that contain nicotine or tobacco, such as cigarettes, e-cigarettes, and chewing tobacco. These can delay cut healing. If you need help quitting, ask your doctor. Keep all follow-up visits. Contact a doctor if: You have any of these signs of infection around your cut: More redness, swelling, or pain. More fluid or blood. New warmth or  hardness. Pus or a bad smell. A new rash. You have a fever. You feel like you may vomit (nauseous). You vomit. You are dizzy. Your stitches, staples, skin glue, or tape strips come undone. Your cut gets bigger. You have a fever. Get help right away if: Your cut bleeds through your bandage, and bleeding does not stop with gentle pressure. Your cut opens up and comes apart. These symptoms may be an emergency. Do not wait to see if the symptoms will go away. Get medical help right away. Call your local emergency services (911 in the [place] Do not drive yourself to the hospital. Summary Follow instructions from your doctor about  how to care for your cut. Wash your hands with soap and water for at least 20 seconds before and after you change your bandage. If you cannot use soap and water, use hand sanitizer. Check your cut area every day for signs of infection. Keep all follow-up visits. This information is not intended to replace advice given to you by your health care provider. Make sure you discuss any questions you have with your health care provider. Document Revised: 05/01/2020 Document Reviewed: 05/01/2020 Elsevier Patient Education  2024 Elsevier Inc.General Anesthesia, Adult, Care After The following information offers guidance on how to care for yourself after your procedure. Your health care provider may also give you more specific instructions. If you have problems or questions, contact your health care provider. What can I expect after the procedure? After the procedure, it is common for people to: Have pain or discomfort at the IV site. Have nausea or vomiting. Have a sore throat or hoarseness. Have trouble concentrating. Feel cold or chills. Feel weak, sleepy, or tired (fatigue). Have soreness and body aches. These can affect parts of the body that were not involved in surgery. Follow these instructions at home: For the time period you were told by your health care  provider:  Rest. Do not participate in activities where you could fall or become injured. Do not drive or use machinery. Do not drink alcohol. Do not take sleeping pills or medicines that cause drowsiness. Do not make important decisions or sign legal documents. Do not take care of children on your own. General instructions Drink enough fluid to keep your urine pale yellow. If you have sleep apnea, surgery and certain medicines can increase your risk for breathing problems. Follow instructions from your health care provider about wearing your sleep device: Anytime you are sleeping, including during daytime naps. While taking prescription pain medicines, sleeping medicines, or medicines that make you drowsy. Return to your normal activities as told by your health care provider. Ask your health care provider what activities are safe for you. Take over-the-counter and prescription medicines only as told by your health care provider. Do not use any products that contain nicotine or tobacco. These products include cigarettes, chewing tobacco, and vaping devices, such as e-cigarettes. These can delay incision healing after surgery. If you need help quitting, ask your health care provider. Contact a health care provider if: You have nausea or vomiting that does not get better with medicine. You vomit every time you eat or drink. You have pain that does not get better with medicine. You cannot urinate or have bloody urine. You develop a skin rash. You have a fever. Get help right away if: You have trouble breathing. You have chest pain. You vomit blood. These symptoms may be an emergency. Get help right away. Call 911. Do not wait to see if the symptoms will go away. Do not drive yourself to the hospital. Summary After the procedure, it is common to have a sore throat, hoarseness, nausea, vomiting, or to feel weak, sleepy, or fatigue. For the time period you were told by your health care  provider, do not drive or use machinery. Get help right away if you have difficulty breathing, have chest pain, or vomit blood. These symptoms may be an emergency. This information is not intended to replace advice given to you by your health care provider. Make sure you discuss any questions you have with your health care provider. Document Revised: 04/27/2021 Document Reviewed: 04/27/2021 Elsevier Patient Education  2024 Elsevier Inc.How to Use Chlorhexidine  at Home in the Shower Chlorhexidine  gluconate (CHG) is a germ-killing (antiseptic) wash that's used to clean the skin. It can get rid of the germs that normally live on the skin and can keep them away for about 24 hours. If you're having surgery, you may be told to shower with CHG at home the night before surgery. This can help lower your risk for infection. To use CHG wash in the shower, follow the steps below. Supplies needed: CHG body wash. Clean washcloth. Clean towel. How to use CHG in the shower Follow these steps unless you're told to use CHG in a different way: Start the shower. Use your normal soap and shampoo to wash your face and hair. Turn off the shower or move out of the shower stream. Pour CHG onto a clean washcloth. Do not use any type of brush or rough sponge. Start at your neck, washing your body down to your toes. Make sure you: Wash the part of your body where the surgery will be done for at least 1 minute. Do not scrub. Do not use CHG on your head or face unless your health care provider tells you to. If it gets into your ears or eyes, rinse them well with water. Do not wash your genitals with CHG. Wash your back and under your arms. Make sure to wash skin folds. Let the CHG sit on your skin for 1-2 minutes or as long as told. Rinse your entire body in the shower, including all body creases and folds. Turn off the shower. Dry off with a clean towel. Do not put anything on your skin afterward, such as powder,  lotion, or perfume. Put on clean clothes or pajamas. If it's the night before surgery, sleep in clean sheets. General tips Use CHG only as told, and follow the instructions on the label. Use the full amount of CHG as told. This is often one bottle. Do not smoke and stay away from flames after using CHG. Your skin may feel sticky after using CHG. This is normal. The sticky feeling will go away as the CHG dries. Do not use CHG: If you have a chlorhexidine  allergy  or have reacted to chlorhexidine  in the past. On open wounds or areas of skin that have broken skin, cuts, or scrapes. On babies younger than 38 months of age. Contact a health care provider if: You have questions about using CHG. Your skin gets irritated or itchy. You have a rash after using CHG. You swallow any CHG. Call your local poison control center (626) 165-8966 in the [place] Your eyes itch badly, or they become very red or swollen. Your hearing changes. You have trouble seeing. If you can't reach your provider, go to an urgent care or emergency room. Do not drive yourself. Get help right away if: You have swelling or tingling in your mouth or throat. You make high-pitched whistling sounds when you breathe, most often when you breathe out (wheeze). You have trouble breathing. These symptoms may be an emergency. Call 911 right away. Do not wait to see if the symptoms will go away. Do not drive yourself to the hospital. This information is not intended to replace advice given to you by your health care provider. Make sure you discuss any questions you have with your health care provider. Document Revised: 08/13/2022 Document Reviewed: 08/09/2021 Elsevier Patient Education  2024 [employer].

## 2023-10-15 ENCOUNTER — Encounter (HOSPITAL_COMMUNITY)
Admission: RE | Admit: 2023-10-15 | Discharge: 2023-10-15 | Disposition: A | Discharge status: Home / Self Care | Source: Ambulatory Visit | Attending: General Surgery | Admitting: General Surgery

## 2023-10-15 DIAGNOSIS — E88819 Insulin resistance, unspecified: Secondary | ICD-10-CM

## 2023-10-16 ENCOUNTER — Encounter (HOSPITAL_COMMUNITY)
Admission: RE | Admit: 2023-10-16 | Discharge: 2023-10-16 | Disposition: A | Discharge status: Home / Self Care | Source: Ambulatory Visit | Attending: General Surgery | Admitting: General Surgery

## 2023-10-16 ENCOUNTER — Encounter (HOSPITAL_COMMUNITY): Payer: Self-pay

## 2023-10-16 ENCOUNTER — Other Ambulatory Visit: Payer: Self-pay

## 2023-10-16 DIAGNOSIS — E88819 Insulin resistance, unspecified: Secondary | ICD-10-CM | POA: Insufficient documentation

## 2023-10-16 LAB — CBC WITH DIFFERENTIAL/PLATELET
Abs Immature Granulocytes: 0.03 K/uL (ref 0.00–0.07)
Basophils Absolute: 0.1 K/uL (ref 0.0–0.1)
Basophils Relative: 1 %
Eosinophils Absolute: 0.1 K/uL (ref 0.0–0.5)
Eosinophils Relative: 2 %
HCT: 43.6 % (ref 36.0–46.0)
Hemoglobin: 14.4 g/dL (ref 12.0–15.0)
Immature Granulocytes: 0 %
Lymphocytes Relative: 28 %
Lymphs Abs: 1.9 K/uL (ref 0.7–4.0)
MCH: 30.4 pg (ref 26.0–34.0)
MCHC: 33 g/dL (ref 30.0–36.0)
MCV: 92 fL (ref 80.0–100.0)
Monocytes Absolute: 0.4 K/uL (ref 0.1–1.0)
Monocytes Relative: 6 %
Neutro Abs: 4.2 K/uL (ref 1.7–7.7)
Neutrophils Relative %: 63 %
Platelets: 162 K/uL (ref 150–400)
RBC: 4.74 MIL/uL (ref 3.87–5.11)
RDW: 13.5 % (ref 11.5–15.5)
WBC: 6.8 K/uL (ref 4.0–10.5)
nRBC: 0 % (ref 0.0–0.2)

## 2023-10-16 LAB — BASIC METABOLIC PANEL WITH GFR
Anion gap: 8 (ref 5–15)
BUN: 16 mg/dL (ref 8–23)
CO2: 24 mmol/L (ref 22–32)
Calcium: 9.6 mg/dL (ref 8.9–10.3)
Chloride: 106 mmol/L (ref 98–111)
Creatinine, Ser: 0.7 mg/dL (ref 0.44–1.00)
GFR, Estimated: >60 mL/min (ref >60)
Glucose, Bld: 97 mg/dL (ref 70–99)
Potassium: 3.8 mmol/L (ref 3.5–5.1)
Sodium: 138 mmol/L (ref 135–145)

## 2023-10-16 NOTE — Anesthesia Preprocedure Evaluation (Signed)
 Anesthesia Evaluation  Patient identified by MRN, date of birth, ID band Patient awake    Reviewed: Allergy  & Precautions, H&P , NPO status , Patient's Chart, lab work & pertinent test results, reviewed documented beta blocker date and time   History of Anesthesia Complications (+) history of anesthetic complications  Airway Mallampati: II  TM Distance: >3 FB Neck ROM: full    Dental no notable dental hx. (+) Dental Advisory Given, Teeth Intact   Pulmonary shortness of breath, former smoker   Pulmonary exam normal breath sounds clear to auscultation       Cardiovascular Exercise Tolerance: Good hypertension, Normal cardiovascular exam Rhythm:regular Rate:Normal  palpitations   Neuro/Psych  PSYCHIATRIC DISORDERS Anxiety Depression    negative neurological ROS     GI/Hepatic Neg liver ROS,GERD  ,,  Endo/Other  Hypothyroidism    Renal/GU negative Renal ROS  negative genitourinary   Musculoskeletal  (+) Arthritis , Osteoarthritis,    Abdominal   Peds  Hematology negative hematology ROS (+)   Anesthesia Other Findings   Reproductive/Obstetrics negative OB ROS                              Anesthesia Physical Anesthesia Plan  ASA: 2  Anesthesia Plan: General   Post-op Pain Management: Minimal or no pain anticipated   Induction:   PONV Risk Score and Plan: Ondansetron , Dexamethasone  and Midazolam   Airway Management Planned: LMA  Additional Equipment: None  Intra-op Plan:   Post-operative Plan:   Informed Consent: I have reviewed the patients History and Physical, chart, labs and discussed the procedure including the risks, benefits and alternatives for the proposed anesthesia with the patient or authorized representative who has indicated his/her understanding and acceptance.     Dental Advisory Given  Plan Discussed with: CRNA  Anesthesia Plan Comments:          Anesthesia Quick Evaluation

## 2023-10-17 ENCOUNTER — Ambulatory Visit (HOSPITAL_COMMUNITY): Payer: Self-pay | Admitting: Anesthesiology

## 2023-10-17 ENCOUNTER — Ambulatory Visit (HOSPITAL_COMMUNITY)
Admission: RE | Admit: 2023-10-17 | Discharge: 2023-10-17 | Disposition: A | Discharge status: Home / Self Care | Attending: General Surgery | Admitting: General Surgery

## 2023-10-17 ENCOUNTER — Encounter (HOSPITAL_COMMUNITY): Payer: Self-pay | Admitting: General Surgery

## 2023-10-17 ENCOUNTER — Encounter (HOSPITAL_COMMUNITY)
Admission: RE | Disposition: A | Discharge status: Home / Self Care | Payer: Self-pay | Source: Home / Self Care | Attending: General Surgery

## 2023-10-17 ENCOUNTER — Encounter (HOSPITAL_COMMUNITY): Payer: Self-pay | Admitting: Anesthesiology

## 2023-10-17 ENCOUNTER — Other Ambulatory Visit: Payer: Self-pay

## 2023-10-17 DIAGNOSIS — R0602 Shortness of breath: Secondary | ICD-10-CM | POA: Diagnosis not present

## 2023-10-17 DIAGNOSIS — Z79899 Other long term (current) drug therapy: Secondary | ICD-10-CM | POA: Insufficient documentation

## 2023-10-17 DIAGNOSIS — D171 Benign lipomatous neoplasm of skin and subcutaneous tissue of trunk: Secondary | ICD-10-CM | POA: Diagnosis not present

## 2023-10-17 DIAGNOSIS — E039 Hypothyroidism, unspecified: Secondary | ICD-10-CM | POA: Diagnosis not present

## 2023-10-17 DIAGNOSIS — D1721 Benign lipomatous neoplasm of skin and subcutaneous tissue of right arm: Secondary | ICD-10-CM | POA: Diagnosis present

## 2023-10-17 DIAGNOSIS — R002 Palpitations: Secondary | ICD-10-CM | POA: Insufficient documentation

## 2023-10-17 DIAGNOSIS — I10 Essential (primary) hypertension: Secondary | ICD-10-CM | POA: Insufficient documentation

## 2023-10-17 DIAGNOSIS — Z87891 Personal history of nicotine dependence: Secondary | ICD-10-CM | POA: Insufficient documentation

## 2023-10-17 DIAGNOSIS — K219 Gastro-esophageal reflux disease without esophagitis: Secondary | ICD-10-CM | POA: Insufficient documentation

## 2023-10-17 DIAGNOSIS — F419 Anxiety disorder, unspecified: Secondary | ICD-10-CM | POA: Insufficient documentation

## 2023-10-17 DIAGNOSIS — M1990 Unspecified osteoarthritis, unspecified site: Secondary | ICD-10-CM | POA: Insufficient documentation

## 2023-10-17 DIAGNOSIS — F32A Depression, unspecified: Secondary | ICD-10-CM | POA: Diagnosis not present

## 2023-10-17 DIAGNOSIS — Z79890 Hormone replacement therapy (postmenopausal): Secondary | ICD-10-CM | POA: Diagnosis not present

## 2023-10-17 HISTORY — PX: EXCISION, MASS, UPPER EXTREMITY: SHX7567

## 2023-10-17 SURGERY — EXCISION, MASS, UPPER EXTREMITY
Anesthesia: General | Site: Back | Laterality: Right

## 2023-10-17 MED ORDER — CHLORHEXIDINE GLUCONATE CLOTH 2 % EX PADS
6.0000 | MEDICATED_PAD | Freq: Once | CUTANEOUS | Status: AC
  Administered 2023-10-17: 6 via TOPICAL

## 2023-10-17 MED ORDER — PROPOFOL 10 MG/ML IV BOLUS
INTRAVENOUS | Status: DC | PRN
  Administered 2023-10-17: 160 mg via INTRAVENOUS

## 2023-10-17 MED ORDER — MIDAZOLAM HCL 2 MG/2ML IJ SOLN
INTRAMUSCULAR | Status: AC
  Filled 2023-10-17: qty 2

## 2023-10-17 MED ORDER — TRAMADOL HCL 50 MG PO TABS: 50.0000 mg | ORAL_TABLET | Freq: Four times a day (QID) | ORAL | 0 refills | Status: AC | PRN

## 2023-10-17 MED ORDER — CHLORHEXIDINE GLUCONATE 0.12 % MT SOLN
15.0000 mL | Freq: Once | OROMUCOSAL | Status: AC
  Administered 2023-10-17: 15 mL via OROMUCOSAL
  Filled 2023-10-17: qty 15

## 2023-10-17 MED ORDER — ONDANSETRON HCL 4 MG/2ML IJ SOLN
INTRAMUSCULAR | Status: DC | PRN
  Administered 2023-10-17: 4 mg via INTRAVENOUS

## 2023-10-17 MED ORDER — DEXAMETHASONE SODIUM PHOSPHATE 10 MG/ML IJ SOLN
INTRAMUSCULAR | Status: AC
  Filled 2023-10-17: qty 1

## 2023-10-17 MED ORDER — ROCURONIUM BROMIDE 100 MG/10ML IV SOLN
INTRAVENOUS | Status: DC | PRN
  Administered 2023-10-17: 40 mg via INTRAVENOUS
  Administered 2023-10-17: 10 mg via INTRAVENOUS

## 2023-10-17 MED ORDER — LACTATED RINGERS IV SOLN: INTRAVENOUS | Status: DC

## 2023-10-17 MED ORDER — OXYCODONE HCL 5 MG/5ML PO SOLN: 5.0000 mg | Freq: Once | ORAL | Status: DC | PRN

## 2023-10-17 MED ORDER — BACITRACIN ZINC 500 UNIT/GM EX OINT
TOPICAL_OINTMENT | CUTANEOUS | Status: AC
  Filled 2023-10-17: qty 1.8

## 2023-10-17 MED ORDER — CHLORHEXIDINE GLUCONATE CLOTH 2 % EX PADS: 6.0000 | MEDICATED_PAD | Freq: Once | CUTANEOUS | Status: DC

## 2023-10-17 MED ORDER — OXYCODONE HCL 5 MG PO TABS: 5.0000 mg | ORAL_TABLET | Freq: Once | ORAL | Status: DC | PRN

## 2023-10-17 MED ORDER — ACETAMINOPHEN 500 MG PO TABS
1000.0000 mg | ORAL_TABLET | Freq: Once | ORAL | Status: AC
  Administered 2023-10-17: 1000 mg via ORAL
  Filled 2023-10-17: qty 2

## 2023-10-17 MED ORDER — FENTANYL CITRATE (PF) 100 MCG/2ML IJ SOLN
INTRAMUSCULAR | Status: DC | PRN
  Administered 2023-10-17: 75 ug via INTRAVENOUS
  Administered 2023-10-17: 25 ug via INTRAVENOUS

## 2023-10-17 MED ORDER — LIDOCAINE 2% (20 MG/ML) 5 ML SYRINGE
INTRAMUSCULAR | Status: DC | PRN
  Administered 2023-10-17: 80 mg via INTRAVENOUS

## 2023-10-17 MED ORDER — BUPIVACAINE HCL (PF) 0.5 % IJ SOLN
INTRAMUSCULAR | Status: DC | PRN
  Administered 2023-10-17: 30 mL

## 2023-10-17 MED ORDER — ONDANSETRON HCL 4 MG PO TABS: 4.0000 mg | ORAL_TABLET | Freq: Three times a day (TID) | ORAL | 1 refills | Status: AC | PRN

## 2023-10-17 MED ORDER — CEFAZOLIN SODIUM-DEXTROSE 2-4 GM/100ML-% IV SOLN
2.0000 g | INTRAVENOUS | Status: AC
  Administered 2023-10-17: 2 g via INTRAVENOUS
  Filled 2023-10-17: qty 100

## 2023-10-17 MED ORDER — ROCURONIUM BROMIDE 10 MG/ML (PF) SYRINGE
PREFILLED_SYRINGE | INTRAVENOUS | Status: AC
  Filled 2023-10-17: qty 30

## 2023-10-17 MED ORDER — BUPIVACAINE HCL (PF) 0.5 % IJ SOLN
INTRAMUSCULAR | Status: AC
  Filled 2023-10-17: qty 30

## 2023-10-17 MED ORDER — SUGAMMADEX SODIUM 200 MG/2ML IV SOLN
INTRAVENOUS | Status: DC | PRN
  Administered 2023-10-17: 200 mg via INTRAVENOUS

## 2023-10-17 MED ORDER — FENTANYL CITRATE (PF) 100 MCG/2ML IJ SOLN
INTRAMUSCULAR | Status: AC
Start: 2023-10-17 — End: 2023-10-17
  Filled 2023-10-17: qty 2

## 2023-10-17 MED ORDER — BACITRACIN 500 UNIT/GM EX OINT
TOPICAL_OINTMENT | CUTANEOUS | Status: DC | PRN
  Administered 2023-10-17: 2

## 2023-10-17 MED ORDER — MIDAZOLAM HCL 5 MG/5ML IJ SOLN
INTRAMUSCULAR | Status: DC | PRN
  Administered 2023-10-17: 2 mg via INTRAVENOUS

## 2023-10-17 MED ORDER — SODIUM CHLORIDE 0.9 % IV SOLN: 12.5000 mg | INTRAVENOUS | Status: DC | PRN

## 2023-10-17 MED ORDER — ORAL CARE MOUTH RINSE
15.0000 mL | Freq: Once | OROMUCOSAL | Status: AC
Start: 2023-10-17 — End: 2023-10-17

## 2023-10-17 MED ORDER — SUGAMMADEX SODIUM 200 MG/2ML IV SOLN
INTRAVENOUS | Status: AC
  Filled 2023-10-17: qty 2

## 2023-10-17 MED ORDER — ACETAMINOPHEN 160 MG/5ML PO SOLN
960.0000 mg | Freq: Once | ORAL | Status: AC
  Filled 2023-10-17: qty 30

## 2023-10-17 MED ORDER — ONDANSETRON HCL 4 MG/2ML IJ SOLN
INTRAMUSCULAR | Status: AC
  Filled 2023-10-17: qty 2

## 2023-10-17 MED ORDER — EPHEDRINE SULFATE-NACL 50-0.9 MG/10ML-% IV SOSY
PREFILLED_SYRINGE | INTRAVENOUS | Status: DC | PRN
  Administered 2023-10-17 (×2): 10 mg via INTRAVENOUS
  Administered 2023-10-17: 5 mg via INTRAVENOUS

## 2023-10-17 MED ORDER — 0.9 % SODIUM CHLORIDE (POUR BTL) OPTIME
TOPICAL | Status: DC | PRN
  Administered 2023-10-17: 1000 mL

## 2023-10-17 MED ORDER — DEXAMETHASONE SODIUM PHOSPHATE 10 MG/ML IJ SOLN
INTRAMUSCULAR | Status: DC | PRN
  Administered 2023-10-17: 8 mg via INTRAVENOUS

## 2023-10-17 MED ORDER — FENTANYL CITRATE PF 50 MCG/ML IJ SOSY: 25.0000 ug | PREFILLED_SYRINGE | INTRAMUSCULAR | Status: DC | PRN

## 2023-10-17 SURGICAL SUPPLY — 31 items
APPLICATOR CHLORAPREP 10.5 ORG (MISCELLANEOUS) ×1 IMPLANT
CLOTH BEACON ORANGE TIMEOUT ST (SAFETY) ×1 IMPLANT
COVER LIGHT HANDLE (MISCELLANEOUS) IMPLANT
DERMABOND ADVANCED .7 DNX12 (GAUZE/BANDAGES/DRESSINGS) IMPLANT
DRAPE UTILITY W/TAPE 26X15 (DRAPES) IMPLANT
ELECTRODE REM PT RTRN 9FT ADLT (ELECTROSURGICAL) ×1 IMPLANT
EVACUATOR DRAINAGE 10X20 100CC (DRAIN) IMPLANT
GAUZE SPONGE 4X4 12PLY STRL (GAUZE/BANDAGES/DRESSINGS) IMPLANT
GLOVE BIOGEL PI IND STRL 6.5 (GLOVE) ×1 IMPLANT
GLOVE BIOGEL PI IND STRL 7.0 (GLOVE) ×2 IMPLANT
GLOVE SURG SS PI 6.5 STRL IVOR (GLOVE) IMPLANT
GLOVE SURG SS PI 7.0 STRL IVOR (GLOVE) IMPLANT
GOWN STRL REUS W/TWL LRG LVL3 (GOWN DISPOSABLE) ×2 IMPLANT
KIT TURNOVER KIT A (KITS) ×1 IMPLANT
MANIFOLD NEPTUNE II (INSTRUMENTS) ×1 IMPLANT
NDL HYPO 21X1.5 SAFETY (NEEDLE) IMPLANT
NEEDLE HYPO 21X1.5 SAFETY (NEEDLE) ×1 IMPLANT
NS IRRIG 1000ML POUR BTL (IV SOLUTION) ×1 IMPLANT
PACK MINOR (CUSTOM PROCEDURE TRAY) IMPLANT
PAD ABD 5X9 TENDERSORB (GAUZE/BANDAGES/DRESSINGS) IMPLANT
PAD ARMBOARD POSITIONER FOAM (MISCELLANEOUS) ×1 IMPLANT
POSITIONER HEAD 8X9X4 ADT (SOFTGOODS) ×1 IMPLANT
SET BASIN LINEN APH (SET/KITS/TRAYS/PACK) ×1 IMPLANT
SPONGE DRAIN TRACH 4X4 STRL 2S (GAUZE/BANDAGES/DRESSINGS) IMPLANT
SPONGE T-LAP 18X18 ~~LOC~~+RFID (SPONGE) IMPLANT
SUT 3-0 BLK 1X30 PSL (SUTURE) IMPLANT
SUT MNCRL AB 4-0 PS2 18 (SUTURE) ×1 IMPLANT
SUT VIC AB 3-0 SH 27X BRD (SUTURE) IMPLANT
SYR 30ML LL (SYRINGE) ×1 IMPLANT
SYR BULB IRRIG 60ML STRL (SYRINGE) IMPLANT
TAPE CLOTH SOFT 2X10 (GAUZE/BANDAGES/DRESSINGS) IMPLANT

## 2023-10-17 NOTE — Op Note (Addendum)
 [facility] Operative Note  10/17/23  Preoperative Diagnosis: Lipoma right upper back /shoulder area    Postoperative Diagnosis: Same   Procedure(s) Performed: Excision of lipoma right upper back 9X8X2.5cm   Surgeon: Kaitlin BROCKS. 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 We discussed excision and risk of bleeding, infection, possible drain placement and finding something unexpected.   Findings: Large lipoma down to the the muscle    Procedure: The patient was taken to the operating room and placed supine. General endotracheal anesthesia was induced. Intravenous antibiotics were  administered per protocol.  She was then placed left lateral position on a bean bag with all pressure points padded and with the right arm on an arm board to expose the right upper back. The right upper back was prepped and draped in the usual sterile fashion.   I had previously marked out the lipoma in preoperative area and looked at the ultrasound images. I made an incision on the langer lines in the back, and carried this down to the subcutaneous tissue. I created flaps of subcutaneous tissue and adipose tissue and got down to an area of more lobular looking adipose. With sharp dissection and cautery, the lipoma was excised in its entirety and measured 9X8X2.5cm. The lipoma was down to the fascia of the muscle.  The wound was irrigated and marcaine  was injected.   A JP drain was placed and secured with a 3-0 Nylon. The deep flap was closed with 3-0 Vicryl interrupted. The skin was closed with vertical mattress 3-0 Nylon.   Neosporin and gauze/ paper tape was placed.   l Final inspection revealed acceptable hemostasis. All counts were correct at the end of the case. The patient was awakened from anesthesia and extubated without complication.  The patient went to the PACU in stable condition.   Kaitlin Kallie, MD [facility] [address] [place], [place] [address] [phone] (office)

## 2023-10-17 NOTE — H&P (Signed)
 [facility] History and Physical  Reason for Referral: Right upper back lipoma     Kaitlin Meyers is a 68 y.o. female.  HPI:    Patient with known lipoma that has been causing her issues. US  demonstrated lipoma. Plan for excision today. No major changes to her health or medication.   Past Medical History:  Diagnosis Date   Anxiety    Arthritis    Atypical chest pain    Back pain    Complication of anesthesia ~1976   difficulty awaking   Depression    Edema    GERD (gastroesophageal reflux disease)    High blood pressure    Hx of migraines 01/15/2011   I've had 2 migraines w/o pain in my life; vision issues   Hyperlipidemia    Hypothyroidism    Joint pain    Palpitation    SOB (shortness of breath)    Vitamin D  deficiency     Past Surgical History:  Procedure Laterality Date   BLADDER SURGERY  2011   mesh   CERVICAL DISC SURGERY  2007   COLONOSCOPY N/A 07/15/2023   Procedure: COLONOSCOPY;  Surgeon: Cindie Carlin POUR, DO;  Location: [facility];  Service: Endoscopy;  Laterality: N/A;  1;00 pm, asa 2   ESOPHAGOGASTRODUODENOSCOPY N/A 07/15/2023   Procedure: EGD (ESOPHAGOGASTRODUODENOSCOPY);  Surgeon: Cindie Carlin POUR, DO;  Location: [facility];  Service: Endoscopy;  Laterality: N/A;   KNEE ARTHROSCOPY  01/2010   right   PARTIAL KNEE ARTHROPLASTY  01/14/2011   Procedure: UNICOMPARTMENTAL KNEE;  Surgeon: Garnette JONETTA Raman, MD;  Location: [facility] OR;  Service: Orthopedics;  Laterality: Right;   REPLACEMENT UNICONDYLAR JOINT KNEE  01/14/11   partial right medial replacement   TEMPOROMANDIBULAR JOINT SURGERY  1987   VAGINAL HYSTERECTOMY  2011    Family History  Problem Relation Age of Onset   Hypertension Mother    Depression Mother    Thyroid disease Father    Stroke Father    Hyperlipidemia Father    Hypertension Father    Stroke Paternal Grandmother    Diabetes Paternal Grandmother    Arthritis Other    Diabetes Other     Social History    Tobacco Use   Smoking status: Former    Types: Cigarettes   Smokeless tobacco: Never  Vaping Use   Vaping status: Never Used  Substance Use Topics   Alcohol use: Yes    Comment: occasional   Drug use: No    Medications: I have reviewed the patient's current medications. Medications Prior to Admission  Medication Sig Dispense Refill   cetirizine HCl (ZYRTEC) 1 MG/ML solution Take 5 mg by mouth daily.     clonazePAM (KLONOPIN) 0.5 MG tablet Take 0.125 mg by mouth at bedtime.     DULoxetine (CYMBALTA) 20 MG capsule Take 20 mg by mouth daily.     hydrocortisone cream 1 % Apply 1 Application topically 3 (three) times daily.     levothyroxine  (SYNTHROID , LEVOTHROID) 112 MCG tablet Take 112 mcg by mouth every morning.  0   VITAMIN D -VITAMIN K PO Take 1 capsule by mouth daily.     pantoprazole  (PROTONIX ) 40 MG tablet Take 1 tablet (40 mg total) by mouth daily before breakfast. 30 tablet 3    Allergies  Allergen Reactions   Sulfa Antibiotics Nausea Only and Swelling   Latex Hives    welps   Azithromycin Other (See Comments)    Pt states medication makes her  have hallucination   Bupropion Other (See Comments)    Unknown reaction    Other     Pt reports not being able to take multiple pain medications causes blood pressure to drop. Unsure of drug names.    Penicillins Hives, Swelling and Other (See Comments)   Azo Yeast Plus [Traumeel] Anxiety      ROS:  A comprehensive review of systems was negative except for: Musculoskeletal: positive for lipoma on right back  Blood pressure (!) 146/78, pulse 89, temperature 98.2 F (36.8 C), resp. rate 12, SpO2 100%. Physical Exam Vitals reviewed.  HENT:     Head: Normocephalic.     Mouth/Throat:     Mouth: Mucous membranes are moist.  Cardiovascular:     Rate and Rhythm: Normal rate.  Pulmonary:     Effort: Pulmonary effort is normal.  Abdominal:     General: There is no distension.     Palpations: Abdomen is soft.      Tenderness: There is no abdominal tenderness.  Musculoskeletal:        General: No swelling.     Comments: Right upper back 9cm area lipoma   Skin:    General: Skin is warm.  Neurological:     General: No focal deficit present.     Mental Status: She is alert and oriented to person, place, and time.  Psychiatric:        Mood and Affect: Mood normal.     Results: Results for orders placed or performed during the hospital encounter of 10/16/23 (from the past 48 hours)  CBC with Differential/Platelet     Status: None   Collection Time: 10/16/23 11:27 AM  Result Value Ref Range   WBC 6.8 4.0 - 10.5 K/uL   RBC 4.74 3.87 - 5.11 MIL/uL   Hemoglobin 14.4 12.0 - 15.0 g/dL   HCT 56.3 63.9 - 53.9 %   MCV 92.0 80.0 - 100.0 fL   MCH 30.4 26.0 - 34.0 pg   MCHC 33.0 30.0 - 36.0 g/dL   RDW 86.4 88.4 - 84.4 %   Platelets 162 150 - 400 K/uL   nRBC 0.0 0.0 - 0.2 %   Neutrophils Relative % 63 %   Neutro Abs 4.2 1.7 - 7.7 K/uL   Lymphocytes Relative 28 %   Lymphs Abs 1.9 0.7 - 4.0 K/uL   Monocytes Relative 6 %   Monocytes Absolute 0.4 0.1 - 1.0 K/uL   Eosinophils Relative 2 %   Eosinophils Absolute 0.1 0.0 - 0.5 K/uL   Basophils Relative 1 %   Basophils Absolute 0.1 0.0 - 0.1 K/uL   Immature Granulocytes 0 %   Abs Immature Granulocytes 0.03 0.00 - 0.07 K/uL    Comment: Performed at [facility], [address], [place], [place] [address]  Basic metabolic panel     Status: None   Collection Time: 10/16/23 11:27 AM  Result Value Ref Range   Sodium 138 135 - 145 mmol/L   Potassium 3.8 3.5 - 5.1 mmol/L   Chloride 106 98 - 111 mmol/L   CO2 24 22 - 32 mmol/L   Glucose, Bld 97 70 - 99 mg/dL    Comment: Glucose reference range applies only to samples taken after fasting for at least 8 hours.   BUN 16 8 - 23 mg/dL   Creatinine, Ser 9.29 0.44 - 1.00 mg/dL   Calcium 9.6 8.9 - 89.6 mg/dL   GFR, Estimated >39 >39 mL/min    Comment: (NOTE) Calculated  using the CKD-EPI Creatinine Equation (2021)     Anion gap 8 5 - 15    Comment: Performed at [facility], [address] [place], [place] [address]    No results found.   Assessment and Plan:  Kaitlin Meyers is a 68 y.o. female with a lipoma. Discussed excision and risk of bleeding, infection, finding something unexpected, possible drain placement.   All questions were answered to the satisfaction of the patient and family.    Manuelita JAYSON Pander 10/17/2023, 7:25 AM

## 2023-10-17 NOTE — Anesthesia Procedure Notes (Signed)
 Procedure Name: Intubation Date/Time: 10/17/2023 7:47 AM  Performed by: Toribio Darice BRAVO, CRNAPre-anesthesia Checklist: Patient identified, Patient being monitored, Timeout performed, Emergency Drugs available and Suction available Patient Re-evaluated:Patient Re-evaluated prior to induction Oxygen Delivery Method: Circle system utilized Preoxygenation: Pre-oxygenation with 100% oxygen Induction Type: IV induction Ventilation: Mask ventilation without difficulty Laryngoscope Size: Mac and 3 Grade View: Grade II Tube type: Oral Tube size: 7.0 mm Number of attempts: 1 Airway Equipment and Method: Stylet Placement Confirmation: ETT inserted through vocal cords under direct vision, positive ETCO2 and breath sounds checked- equal and bilateral Secured at: 21 cm Tube secured with: Tape Dental Injury: Teeth and Oropharynx as per pre-operative assessment  Comments: Intubated with upper teeth guard in place.  Limited neck movement.

## 2023-10-17 NOTE — Discharge Instructions (Addendum)
 Wound Care: Leave the bandage in place for 48 hours. You can remove on 10/19/2023. If the dressing is saturated you can change it before.  After you remove the dressing, you can place neosporin or bactracin on the area and cover with gauze and paper tape.   JP Drain Care:  Please keep the drain clean and dry. Replace the gauze/ tape around the drain if it gets dirty or wet/ saturated. Please do not mess with or cut the stitch that is keeping the drain in place. Secure the drain to your clothes so that it does not get dislodged.  Please record the output from the drain daily including the color and the amount in milliliters.  Please keep the drain and incision covered with plastic and tape when you shower so that it does not get wet. Or you can birdbath and wash your hair in the sink if you prefer that while the drain is in place.   Common Complaints: Pain at the incision site is common.  Some nausea is common and poor appetite. The main goal is to stay hydrated the first few days after surgery.   Diet/ Activity: Diet as tolerated. You may not have an appetite, but it is important to stay hydrated. Drink 64 ounces of water a day. Your appetite will return with time.  Shower per your regular routine daily.  Do not take hot showers. Take warm showers that are less than 10 minutes. Walk everyday for at least 15-20 minutes. Deep cough and move around every 1-2 hours in the first few days after surgery.  Limit excessive movement, lifting > 10 lbs, stretching with the limb if there is an incision on your arm/armpit or leg.   Limit stretching, pulling on your incision if it is located on other parts of your body.   Medication: Take tylenol  and ibuprofen as needed for pain control, alternating every 4-6 hours.  Example:  Tylenol  1000mg  @ 6am, 12noon, 6pm, (Do not exceed 4000mg  of tylenol  a day). Ibuprofen 800mg  @ 9am, 3pm, 9pm, 3am (Do not exceed 3600mg  of ibuprofen a day).  Take Tramadol   for breakthrough pain every 6  hours.  Take Colace for constipation related to narcotic pain medication. If you do not have a bowel movement in 2 days, take Miralax over the counter.  Drink plenty of water to also prevent constipation.   Contact Information: If you have questions or concerns, please call our office, [phone], Monday- Thursday 8AM-5PM and Friday 8AM-12Noon.  If it is after hours or on the weekend, please call [facility], [phone], [phone] and ask to speak to the surgeon on call for Dr. Kallie at [facility].

## 2023-10-17 NOTE — Anesthesia Postprocedure Evaluation (Signed)
 Anesthesia Post Note  Patient: Kaitlin Meyers  Procedure(s) Performed: EXCISION, MASS, RIGHT UPPER BACK (Right: Back)  Patient location during evaluation: PACU Anesthesia Type: General Level of consciousness: awake and alert Pain management: pain level controlled Vital Signs Assessment: post-procedure vital signs reviewed and stable Respiratory status: spontaneous breathing, nonlabored ventilation, respiratory function stable and patient connected to nasal cannula oxygen Cardiovascular status: blood pressure returned to baseline and stable Postop Assessment: no apparent nausea or vomiting Anesthetic complications: no   There were no known notable events for this encounter.   Last Vitals:  Vitals:   10/17/23 1015 10/17/23 1019  BP: 131/80 126/70  Pulse: 84 83  Resp: 11   Temp: 36.5 C 36.5 C  SpO2: 92% 92%    Last Pain:  Vitals:   10/17/23 1019  PainSc: 1                  Ebert Forrester L Lindyn Vossler

## 2023-10-17 NOTE — Transfer of Care (Signed)
 Immediate Anesthesia Transfer of Care Note  Patient: Kaitlin Meyers  Procedure(s) Performed: EXCISION, MASS, RIGHT UPPER BACK (Right: Back)  Patient Location: PACU  Anesthesia Type:General  Level of Consciousness: awake  Airway & Oxygen Therapy: Patient connected to nasal cannula oxygen  Post-op Assessment: Post -op Vital signs reviewed and stable  Post vital signs: Reviewed and stable  Last Vitals:  Vitals Value Taken Time  BP 143/75 10/17/23 09:45  Temp    Pulse 83 10/17/23 09:51  Resp 13 10/17/23 09:51  SpO2 95 % 10/17/23 09:51  Vitals shown include unfiled device data.  Last Pain: There were no vitals filed for this visit.       Complications: No notable events documented.

## 2023-10-17 NOTE — Progress Notes (Signed)
 [facility]  Updated her husband, JP in place and will see on Thursday to see if can remove.  Manuelita Pander, MD [facility] [address] [place], [place] [address] [phone] (office)

## 2023-10-18 ENCOUNTER — Encounter (HOSPITAL_COMMUNITY): Payer: Self-pay | Admitting: General Surgery

## 2023-10-20 ENCOUNTER — Ambulatory Visit (INDEPENDENT_AMBULATORY_CARE_PROVIDER_SITE_OTHER): Admitting: General Surgery

## 2023-10-20 DIAGNOSIS — D171 Benign lipomatous neoplasm of skin and subcutaneous tissue of trunk: Secondary | ICD-10-CM

## 2023-10-20 LAB — SURGICAL PATHOLOGY

## 2023-10-20 NOTE — Progress Notes (Signed)
 Let patient known the mass was just a lipoma.

## 2023-10-23 ENCOUNTER — Encounter: Payer: Self-pay | Admitting: General Surgery

## 2023-10-23 ENCOUNTER — Ambulatory Visit (INDEPENDENT_AMBULATORY_CARE_PROVIDER_SITE_OTHER): Admitting: General Surgery

## 2023-10-23 VITALS — BP 147/81 | HR 82 | Temp 98.1°F | Resp 14 | Ht 64.0 in | Wt 225.0 lb

## 2023-10-23 DIAGNOSIS — D171 Benign lipomatous neoplasm of skin and subcutaneous tissue of trunk: Secondary | ICD-10-CM

## 2023-10-23 NOTE — Patient Instructions (Signed)
 Leave the dressing in place until Saturday afternoon. Replace if needed if it gets saturated.  You can leave the dressing off unless the hole from the drain is still open, then place a new dressing for 48 hours over the opening.

## 2023-10-23 NOTE — Progress Notes (Unsigned)
 [facility]  Pathology: Mature adipose tissue consistent with lipoma  Future Appointments  Date Time Provider Department Center  10/30/2023  1:15 PM Kallie Manuelita BROCKS, MD [facility] None

## 2023-10-24 ENCOUNTER — Encounter: Payer: Self-pay | Admitting: Internal Medicine

## 2023-10-28 ENCOUNTER — Ambulatory Visit (INDEPENDENT_AMBULATORY_CARE_PROVIDER_SITE_OTHER): Admitting: General Surgery

## 2023-10-28 ENCOUNTER — Encounter: Payer: Self-pay | Admitting: General Surgery

## 2023-10-28 VITALS — BP 127/85 | HR 75 | Temp 98.2°F | Resp 14 | Ht 64.0 in | Wt 226.0 lb

## 2023-10-28 DIAGNOSIS — D171 Benign lipomatous neoplasm of skin and subcutaneous tissue of trunk: Secondary | ICD-10-CM

## 2023-10-28 NOTE — Patient Instructions (Signed)
 Steristrips will peel off in the next 5-7 days. You can remove them once they are peeling off. It is ok to shower. Pat the area dry.

## 2023-10-29 NOTE — Progress Notes (Signed)
 [facility]  Doing well. No issues.   BP 127/85   Pulse 75   Temp 98.2 F (36.8 C) (Oral)   Resp 14   Ht 5' 4 (1.626 m)   Wt 226 lb (102.5 kg)   SpO2 94%   BMI 38.79 kg/m  Sutures in place, sutures removed and no erythema or drainage, steri strips placed  Minor seroma noted under the skin   Patient s/p lipoma excision.  Steristrips will peel off in the next 5-7 days. You can remove them once they are peeling off. It is ok to shower. Pat the area dry.   PRN Follow up  Manuelita Pander, MD [facility] [address] [place], [place] [address] [phone] (office)

## 2023-10-30 ENCOUNTER — Encounter: Admitting: General Surgery

## 2023-12-17 ENCOUNTER — Encounter: Payer: Self-pay | Admitting: General Surgery

## 2023-12-17 ENCOUNTER — Ambulatory Visit: Admitting: General Surgery

## 2023-12-17 VITALS — BP 118/78 | HR 75 | Temp 98.0°F | Resp 16 | Ht 64.0 in | Wt 229.0 lb

## 2023-12-17 DIAGNOSIS — T148XXA Other injury of unspecified body region, initial encounter: Secondary | ICD-10-CM

## 2023-12-17 NOTE — Patient Instructions (Addendum)
 We can aspirate (remove the fluid) in the seroma if you like or if it is causing problems or pain.  Call if you decide you want that removed. We will use a needle to numb the area and then a larger needle to remove the fluid. Risk of bleeding, infection, recurrence of the seroma are possible.   These usually go away with time but can take a few months. I do not think massage will hurt or help the area.  Call with issues.

## 2023-12-18 NOTE — Progress Notes (Signed)
 [facility]  Patient concerned that her lipoma has recurred.  BP 118/78   Pulse 75   Temp 98 F (36.7 C) (Oral)   Resp 16   Ht 5' 4 (1.626 m)   Wt 229 lb (103.9 kg)   SpO2 92%   BMI 39.31 kg/m  Seroma in the upper back, fluid wave noted, no signs of infection, nontender  Patient s/p lipoma excision. Doing well but has a seroma.  We can aspirate (remove the fluid) in the seroma if you like or if it is causing problems or pain.  Call if you decide you want that removed. We will use a needle to numb the area and then a larger needle to remove the fluid. Risk of bleeding, infection, recurrence of the seroma are possible.   These usually go away with time but can take a few months. I do not think massage will hurt or help the area.  Call with issues.   Manuelita Pander, MD [facility] [address] [place], [place] [address] [phone] (office)

## 2024-01-10 ENCOUNTER — Other Ambulatory Visit: Payer: Self-pay | Admitting: Gastroenterology

## 2024-01-10 DIAGNOSIS — K2270 Barrett's esophagus without dysplasia: Secondary | ICD-10-CM
